# Patient Record
Sex: Female | Born: 1980 | Race: Black or African American | Hispanic: No | Marital: Single | Smoking: Never smoker
Health system: Southern US, Community
[De-identification: ages and names within clinical notes are randomized; demographics above are authoritative.]

## PROBLEM LIST (undated history)

## (undated) DIAGNOSIS — E669 Obesity, unspecified: Secondary | ICD-10-CM

## (undated) DIAGNOSIS — M1990 Unspecified osteoarthritis, unspecified site: Secondary | ICD-10-CM

## (undated) DIAGNOSIS — K219 Gastro-esophageal reflux disease without esophagitis: Secondary | ICD-10-CM

## (undated) DIAGNOSIS — N159 Renal tubulo-interstitial disease, unspecified: Secondary | ICD-10-CM

## (undated) DIAGNOSIS — B009 Herpesviral infection, unspecified: Secondary | ICD-10-CM

## (undated) HISTORY — DX: Obesity, unspecified: E66.9

## (undated) HISTORY — PX: DILATION AND CURETTAGE OF UTERUS: SHX78

---

## 2007-08-16 ENCOUNTER — Emergency Department (HOSPITAL_COMMUNITY): Admission: EM | Admit: 2007-08-16 | Discharge: 2007-08-16 | Payer: Self-pay | Admitting: Emergency Medicine

## 2008-11-21 ENCOUNTER — Other Ambulatory Visit: Admission: RE | Admit: 2008-11-21 | Discharge: 2008-11-21 | Payer: Self-pay | Admitting: Obstetrics & Gynecology

## 2009-05-12 ENCOUNTER — Ambulatory Visit (HOSPITAL_COMMUNITY): Admission: RE | Admit: 2009-05-12 | Discharge: 2009-05-12 | Payer: Self-pay | Admitting: Obstetrics and Gynecology

## 2009-05-15 ENCOUNTER — Ambulatory Visit (HOSPITAL_COMMUNITY): Admission: RE | Admit: 2009-05-15 | Discharge: 2009-05-15 | Payer: Self-pay | Admitting: Obstetrics and Gynecology

## 2009-05-29 ENCOUNTER — Ambulatory Visit (HOSPITAL_COMMUNITY): Admission: RE | Admit: 2009-05-29 | Discharge: 2009-05-29 | Payer: Self-pay | Admitting: Obstetrics and Gynecology

## 2009-06-19 ENCOUNTER — Ambulatory Visit (HOSPITAL_COMMUNITY): Admission: RE | Admit: 2009-06-19 | Discharge: 2009-06-19 | Payer: Self-pay | Admitting: Obstetrics and Gynecology

## 2009-06-24 ENCOUNTER — Ambulatory Visit (HOSPITAL_COMMUNITY): Admission: RE | Admit: 2009-06-24 | Discharge: 2009-06-24 | Payer: Self-pay | Admitting: Obstetrics and Gynecology

## 2010-08-04 ENCOUNTER — Ambulatory Visit (HOSPITAL_COMMUNITY): Admission: RE | Admit: 2010-08-04 | Discharge: 2010-08-04 | Payer: Self-pay | Admitting: Obstetrics & Gynecology

## 2010-12-03 ENCOUNTER — Ambulatory Visit (HOSPITAL_COMMUNITY): Admission: RE | Admit: 2010-12-03 | Payer: Self-pay | Source: Home / Self Care | Admitting: Obstetrics & Gynecology

## 2010-12-09 ENCOUNTER — Ambulatory Visit (HOSPITAL_COMMUNITY): Admission: RE | Admit: 2010-12-09 | Payer: Self-pay | Source: Home / Self Care | Admitting: Obstetrics & Gynecology

## 2011-01-10 ENCOUNTER — Encounter: Payer: Self-pay | Admitting: Obstetrics & Gynecology

## 2011-02-12 ENCOUNTER — Inpatient Hospital Stay (HOSPITAL_COMMUNITY)
Admission: AD | Admit: 2011-02-12 | Discharge: 2011-02-12 | Disposition: A | Discharge status: Home / Self Care | Payer: Medicaid Other | Source: Ambulatory Visit | Attending: Obstetrics & Gynecology | Admitting: Obstetrics & Gynecology

## 2011-02-12 DIAGNOSIS — K5289 Other specified noninfective gastroenteritis and colitis: Secondary | ICD-10-CM

## 2011-02-12 DIAGNOSIS — O212 Late vomiting of pregnancy: Secondary | ICD-10-CM | POA: Insufficient documentation

## 2011-02-12 DIAGNOSIS — O9989 Other specified diseases and conditions complicating pregnancy, childbirth and the puerperium: Secondary | ICD-10-CM

## 2011-02-12 DIAGNOSIS — O99891 Other specified diseases and conditions complicating pregnancy: Secondary | ICD-10-CM | POA: Insufficient documentation

## 2011-02-12 LAB — URINALYSIS, ROUTINE W REFLEX MICROSCOPIC
Bilirubin Urine: NEGATIVE
Nitrite: NEGATIVE
Specific Gravity, Urine: 1.02 (ref 1.005–1.030)
Urobilinogen, UA: 1 mg/dL (ref 0.0–1.0)
pH: 6.5 (ref 5.0–8.0)

## 2011-02-12 LAB — URINE MICROSCOPIC-ADD ON

## 2011-02-15 LAB — URINE CULTURE: Colony Count: >=100000

## 2011-02-24 ENCOUNTER — Inpatient Hospital Stay (HOSPITAL_COMMUNITY)
Admission: AD | Admit: 2011-02-24 | Discharge: 2011-02-26 | DRG: 775 | Disposition: A | Discharge status: Home / Self Care | Payer: 59 | Source: Ambulatory Visit | Attending: Obstetrics & Gynecology | Admitting: Obstetrics & Gynecology

## 2011-02-24 ENCOUNTER — Inpatient Hospital Stay (HOSPITAL_COMMUNITY): Payer: 59

## 2011-02-24 LAB — CBC
Hemoglobin: 9.3 g/dL — ABNORMAL LOW (ref 12.0–15.0)
MCH: 18.8 pg — ABNORMAL LOW (ref 26.0–34.0)
Platelets: 271 10*3/uL (ref 150–400)
RBC: 4.95 MIL/uL (ref 3.87–5.11)
WBC: 8.8 10*3/uL (ref 4.0–10.5)

## 2011-02-24 LAB — RPR: RPR Ser Ql: NONREACTIVE

## 2011-02-25 ENCOUNTER — Other Ambulatory Visit: Payer: Self-pay | Admitting: Obstetrics & Gynecology

## 2011-02-25 LAB — CBC
HCT: 28.6 % — ABNORMAL LOW (ref 36.0–46.0)
Hemoglobin: 8.6 g/dL — ABNORMAL LOW (ref 12.0–15.0)
MCHC: 30.1 g/dL (ref 30.0–36.0)
RBC: 4.56 MIL/uL (ref 3.87–5.11)
WBC: 12.2 10*3/uL — ABNORMAL HIGH (ref 4.0–10.5)

## 2011-03-01 NOTE — H&P (Signed)
  NAME:  Draheim, Verdis            ACCOUNT NO.:  [id number]  MEDICAL RECORD NO.:  [id number]           PATIENT TYPE:  I  LOCATION:  9131                          FACILITY:  [facility]  PHYSICIAN:  Roseanna Rainbow, M.D.DATE OF BIRTH:  Mar 22, 1981  DATE OF ADMISSION:  02/24/2011 DATE OF DISCHARGE:                             HISTORY & PHYSICAL   CHIEF COMPLAINT:  The patient is a 30 year old para 1 with an estimated date of confinement of March 19, complaining of rupture of membranes.  HISTORY OF PRESENT ILLNESS:  The patient reports spontaneous rupture of membranes several hours prior to presentation with clear fluid.  She denies regular contractions.  ALLERGIES:  PENICILLIN.  MEDICATIONS:  Valtrex.  PAST OBSTETRICS HISTORY:  There is a history of a voluntary termination of pregnancy.  In October 2010, she was delivered at term, 7-pound female, vaginal delivery, no complications.  PRENATAL LABS:  GBS negative on February 13.  Chlamydia probe negative. Urine culture and sensitivity, no growth.  Pap smear, ASCUS.  GC probe negative.  Two-hour GTT normal.  Hepatitis B surface antigen negative. Hematocrit 34.1.  Herpes culture positive August 2011.  Hemoglobin 10.4, HIV nonreactive, platelets 312,000.  Blood type AB positive, antibody screen negative, RPR nonreactive, rubella immune.  Sickle cell negative. Vitamin D 17 in July 2011; 11 in February 2012.  Ultrasound on August 16, crown rump length 9 weeks 1 day with an [facility] of August 19.  There is a complex right adnexal mass suspicious for dermoid.  An ultrasound at 19 weeks, normal anatomy.  OBSTETRICS RISK FACTORS:  History of genital herpes and obesity.  PAST GYNECOLOGIC HISTORY:  Please see the above.  PAST MEDICAL HISTORY:  Vitamin D deficiency.  PAST SURGICAL HISTORY:  No previous surgery.  SOCIAL HISTORY:  She works as a Haematologist.  She is married, living with her spouse.  Does not give any significant history of  alcohol usage.  Has no significant smoking history.  She reports stress at work and financial difficulties.  She denies illicit drug use.  FAMILY HISTORY:  Remarkable for adult-onset diabetes, hypertension.  GENETIC HISTORY:  The patient's daughter has had cleft lip.  REVIEW OF SYSTEMS:  GU:  Please see the above.  PHYSICAL EXAMINATION:  VITAL SIGNS:  Stable, afebrile.  Fetal heart tracing moderate long-term variability, accelerations present.  No decelerations.  Tocodynamometer, rare uterine contractions. GENERAL EXAM:  African American female, obese body habitus.  No acute distress. ABDOMEN:  Gravid.  Sterile vaginal exam per the RN.  The cervix is loose, 1-cm dilated.  There is gross rupture of fluid.  ASSESSMENT: 1. Primipara, at term. 2. Spontaneous rupture of membranes. 3. Group B streptococcus negative. 4. History of genital herpes, no symptoms at present, on Valtrex for     suppression. 5. Category I fetal heart tracing.  PLAN:  Admission, likely augmentation of labor with low-dose Pitocin per protocol.     Roseanna Rainbow, M.D.     Judee Clara  D:  02/24/2011  T:  02/25/2011  Job:  [id number]  Electronically Signed by Antionette Char M.D. on 03/01/2011 09:51:14 PM

## 2011-04-30 ENCOUNTER — Other Ambulatory Visit: Payer: Self-pay | Admitting: Obstetrics & Gynecology

## 2011-04-30 ENCOUNTER — Encounter (HOSPITAL_COMMUNITY): Payer: 59 | Attending: Obstetrics & Gynecology

## 2011-04-30 DIAGNOSIS — Z01812 Encounter for preprocedural laboratory examination: Secondary | ICD-10-CM | POA: Insufficient documentation

## 2011-04-30 LAB — CBC
Hemoglobin: 9.9 g/dL — ABNORMAL LOW (ref 12.0–15.0)
MCH: 19.5 pg — ABNORMAL LOW (ref 26.0–34.0)
MCV: 65.9 fL — ABNORMAL LOW (ref 78.0–100.0)
Platelets: 307 10*3/uL (ref 150–400)
RBC: 5.07 MIL/uL (ref 3.87–5.11)
WBC: 6 10*3/uL (ref 4.0–10.5)

## 2011-05-06 ENCOUNTER — Ambulatory Visit (HOSPITAL_COMMUNITY): Admission: RE | Admit: 2011-05-06 | Payer: 59 | Source: Ambulatory Visit | Admitting: Obstetrics & Gynecology

## 2011-05-06 LAB — PREGNANCY, URINE: Preg Test, Ur: NEGATIVE

## 2011-07-23 ENCOUNTER — Encounter (HOSPITAL_COMMUNITY): Admission: RE | Payer: Self-pay | Source: Ambulatory Visit

## 2011-07-23 ENCOUNTER — Ambulatory Visit (HOSPITAL_COMMUNITY): Admission: RE | Admit: 2011-07-23 | Payer: Self-pay | Source: Ambulatory Visit | Admitting: Obstetrics & Gynecology

## 2011-07-23 SURGERY — ESSURE TUBAL STERILIZATION
Anesthesia: General | Laterality: Bilateral

## 2011-10-01 LAB — COMPREHENSIVE METABOLIC PANEL
ALT: 18
AST: 24
Alkaline Phosphatase: 69
CO2: 23
Calcium: 8.7
GFR calc Af Amer: >60
Potassium: 3.8
Sodium: 138
Total Protein: 7.2

## 2011-10-01 LAB — URINALYSIS, ROUTINE W REFLEX MICROSCOPIC
Bilirubin Urine: NEGATIVE
Glucose, UA: NEGATIVE
Hgb urine dipstick: NEGATIVE
Ketones, ur: NEGATIVE
Nitrite: NEGATIVE
Protein, ur: NEGATIVE
Specific Gravity, Urine: 1.031 — ABNORMAL HIGH
Urobilinogen, UA: 0.2
pH: 6

## 2011-10-01 LAB — URINE MICROSCOPIC-ADD ON

## 2011-10-01 LAB — DIFFERENTIAL
Basophils Relative: 1
Eosinophils Relative: 0
Lymphs Abs: 1.2
Monocytes Absolute: 0.2
Neutro Abs: 10 — ABNORMAL HIGH

## 2011-10-01 LAB — CBC
MCHC: 31.5
RBC: 5.32 — ABNORMAL HIGH
RDW: 20.9 — ABNORMAL HIGH

## 2011-10-01 LAB — POCT PREGNANCY, URINE
Operator id: 26403
Preg Test, Ur: NEGATIVE

## 2011-12-30 ENCOUNTER — Ambulatory Visit: Payer: Self-pay | Admitting: Family Medicine

## 2011-12-30 ENCOUNTER — Ambulatory Visit (INDEPENDENT_AMBULATORY_CARE_PROVIDER_SITE_OTHER): Payer: 59 | Admitting: Family Medicine

## 2011-12-30 DIAGNOSIS — E669 Obesity, unspecified: Secondary | ICD-10-CM

## 2011-12-30 DIAGNOSIS — S335XXA Sprain of ligaments of lumbar spine, initial encounter: Secondary | ICD-10-CM

## 2012-01-20 ENCOUNTER — Ambulatory Visit: Payer: 59 | Admitting: Family Medicine

## 2012-06-01 LAB — HEMOGLOBIN
Hemoglobin: 13.1 g/dL (ref 12.0–16.0)
Hgb A1c MFr Bld: 5.7 % (ref 4.0–6.0)

## 2012-06-01 LAB — LIPID PANEL
Cholesterol: 131 mg/dL (ref 0–200)
HDL: 42 mg/dL (ref 35–70)
LDL (calc): 80
Total CHOL/HDL Ratio: 3.1
Triglycerides: 47
VLDL: 9 mg/dL

## 2012-06-23 ENCOUNTER — Ambulatory Visit (INDEPENDENT_AMBULATORY_CARE_PROVIDER_SITE_OTHER): Payer: 59 | Admitting: Internal Medicine

## 2012-06-23 ENCOUNTER — Encounter: Payer: Self-pay | Admitting: Internal Medicine

## 2012-06-23 VITALS — BP 118/78 | HR 82 | Temp 97.8°F | Resp 16 | Ht 66.0 in | Wt 256.0 lb

## 2012-06-23 DIAGNOSIS — E669 Obesity, unspecified: Secondary | ICD-10-CM

## 2012-06-23 DIAGNOSIS — R7309 Other abnormal glucose: Secondary | ICD-10-CM

## 2012-06-23 DIAGNOSIS — R7303 Prediabetes: Secondary | ICD-10-CM

## 2012-06-23 NOTE — Progress Notes (Signed)
  Subjective:    Patient ID: Meredith Long, female    DOB: 04-18-1981, 31 y.o.   MRN: [id number]  HPI She comes in to discuss her mild blood sugar elevations.   Review of Systems  Constitutional: Negative.   HENT: Negative.   Eyes: Negative.   Respiratory: Negative.   Cardiovascular: Negative.   Gastrointestinal: Negative.   Genitourinary: Negative.   Musculoskeletal: Negative.   Skin: Negative.   Neurological: Negative.   Hematological: Negative.   Psychiatric/Behavioral: Negative.        Objective:   Physical Exam  Vitals reviewed. Constitutional: She is oriented to person, place, and time. She appears well-developed and well-nourished. No distress.  HENT:  Head: Normocephalic and atraumatic.  Mouth/Throat: Oropharynx is clear and moist. No oropharyngeal exudate.  Eyes: Conjunctivae are normal. Right eye exhibits no discharge. Left eye exhibits no discharge. No scleral icterus.  Neck: Normal range of motion. Neck supple. No JVD present. No tracheal deviation present. No thyromegaly present.  Cardiovascular: Normal rate, regular rhythm, normal heart sounds and intact distal pulses.  Exam reveals no gallop and no friction rub.   No murmur heard. Pulmonary/Chest: Effort normal and breath sounds normal. No stridor. No respiratory distress. She has no wheezes. She has no rales. She exhibits no tenderness.  Abdominal: Soft. Bowel sounds are normal. She exhibits no distension and no mass. There is no tenderness. There is no rebound and no guarding.  Musculoskeletal: Normal range of motion. She exhibits no edema and no tenderness.  Lymphadenopathy:    She has no cervical adenopathy.  Neurological: She is oriented to person, place, and time.  Skin: Skin is warm and dry. No rash noted. She is not diaphoretic. No erythema. No pallor.  Psychiatric: She has a normal mood and affect. Her behavior is normal. Judgment and thought content normal.      Lab Results  Component Value  Date   WBC 6.0 04/30/2011   HGB 13.1 05/22/2012   HCT 33.4* 04/30/2011   PLT 307 04/30/2011   GLUCOSE 123* 08/16/2007   CHOL 131 05/22/2012   TRIG 47 05/22/2012   HDL 42 05/22/2012   LDLCALC 80 05/22/2012   ALT 18 08/16/2007   AST 24 08/16/2007   NA 138 08/16/2007   K 3.8 08/16/2007   CL 107 08/16/2007   CREATININE 0.66 08/16/2007   BUN 7 08/16/2007   CO2 23 08/16/2007   HGBA1C 5.7 05/22/2012      Assessment & Plan:

## 2012-06-23 NOTE — Assessment & Plan Note (Signed)
She is doing well with lifestyle modifications, I think seeing nutrition/diabetic education will help as well

## 2012-06-23 NOTE — Patient Instructions (Signed)
Hyperglycemia  Hyperglycemia occurs when the glucose (sugar) in your blood is too high. Hyperglycemia can happen for many reasons, but it most often happens to people who do not know they have diabetes or are not managing their diabetes properly.   CAUSES   Whether you have diabetes or not, there are other causes of hyperglycemia. Hyperglycemia can occur when you have diabetes, but it can also occur in other situations that you might not be as aware of, such as:  Diabetes  · If you have diabetes and are having problems controlling your blood glucose, hyperglycemia could occur because of some of the following reasons:  · Not following your meal plan.  · Not taking your diabetes medications or not taking it properly.  · Exercising less or doing less activity than you normally do.  · Being sick.  Pre-diabetes  · This cannot be ignored. Before people develop Type 2 diabetes, they almost always have "pre-diabetes." This is when your blood glucose levels are higher than normal, but not yet high enough to be diagnosed as diabetes. Research has shown that some long-term damage to the body, especially the heart and circulatory system, may already be occurring during pre-diabetes. If you take action to manage your blood glucose when you have pre-diabetes, you may delay or prevent Type 2 diabetes from developing.  Stress  · If you have diabetes, you may be "diet" controlled or on oral medications or insulin to control your diabetes. However, you may find that your blood glucose is higher than usual in the hospital whether you have diabetes or not. This is often referred to as "stress hyperglycemia." Stress can elevate your blood glucose. This happens because of hormones put out by the body during times of stress. If stress has been the cause of your high blood glucose, it can be followed regularly by your caregiver. That way he/she can make sure your hyperglycemia does not continue to get worse or progress to  diabetes.  Steroids  · Steroids are medications that act on the infection fighting system (immune system) to block inflammation or infection. One side effect can be a rise in blood glucose. Most people can produce enough extra insulin to allow for this rise, but for those who cannot, steroids make blood glucose levels go even higher. It is not unusual for steroid treatments to "uncover" diabetes that is developing. It is not always possible to determine if the hyperglycemia will go away after the steroids are stopped. A special blood test called an A1c is sometimes done to determine if your blood glucose was elevated before the steroids were started.  SYMPTOMS  · Thirsty.  · Frequent urination.  · Dry mouth.  · Blurred vision.  · Tired or fatigue.  · Weakness.  · Sleepy.  · Tingling in feet or leg.  DIAGNOSIS   Diagnosis is made by monitoring blood glucose in one or all of the following ways:  · A1c test. This is a chemical found in your blood.  · Fingerstick blood glucose monitoring.  · Laboratory results.  TREATMENT   First, knowing the cause of the hyperglycemia is important before the hyperglycemia can be treated. Treatment may include, but is not be limited to:  · Education.  · Change or adjustment in medications.  · Change or adjustment in meal plan.  · Treatment for an illness, infection, etc.  · More frequent blood glucose monitoring.  · Change in exercise plan.  · Decreasing or stopping steroids.  ·   Lifestyle changes.  HOME CARE INSTRUCTIONS   · Test your blood glucose as directed.  · Exercise regularly. Your caregiver will give you instructions about exercise. Pre-diabetes or diabetes which comes on with stress is helped by exercising.  · Eat wholesome, balanced meals. Eat often and at regular, fixed times. Your caregiver or nutritionist will give you a meal plan to guide your sugar intake.  · Being at an ideal weight is important. If needed, losing as little as 10 to 15 pounds may help improve blood  glucose levels.  SEEK MEDICAL CARE IF:   · You have questions about medicine, activity, or diet.  · You continue to have symptoms (problems such as increased thirst, urination, or weight gain).  SEEK IMMEDIATE MEDICAL CARE IF:   · You are vomiting or have diarrhea.  · Your breath smells fruity.  · You are breathing faster or slower.  · You are very sleepy or incoherent.  · You have numbness, tingling, or pain in your feet or hands.  · You have chest pain.  · Your symptoms get worse even though you have been following your caregiver's orders.  · If you have any other questions or concerns.  Document Released: 06/01/2001 Document Revised: 11/25/2011 Document Reviewed: 07/28/2009  ExitCare® Patient Information ©2012 [employer].

## 2012-06-23 NOTE — Assessment & Plan Note (Signed)
She has made great improvements in this over the last year by losing 30 pounds, she will see nutrition for further support about her diet

## 2012-07-03 ENCOUNTER — Other Ambulatory Visit: Payer: Self-pay | Admitting: Obstetrics

## 2012-08-29 ENCOUNTER — Ambulatory Visit (INDEPENDENT_AMBULATORY_CARE_PROVIDER_SITE_OTHER): Payer: 59 | Admitting: Internal Medicine

## 2012-08-29 ENCOUNTER — Encounter: Payer: Self-pay | Admitting: Internal Medicine

## 2012-08-29 VITALS — BP 108/80 | HR 76 | Temp 98.8°F | Resp 16 | Ht 66.0 in | Wt 249.0 lb

## 2012-08-29 DIAGNOSIS — E669 Obesity, unspecified: Secondary | ICD-10-CM

## 2012-08-29 MED ORDER — PHENTERMINE-TOPIRAMATE ER 7.5-46 MG PO CP24: 1.0000 | ORAL_CAPSULE | Freq: Every morning | ORAL | Status: DC

## 2012-08-29 MED ORDER — PHENTERMINE-TOPIRAMATE ER 3.75-23 MG PO CP24: 1.0000 | ORAL_CAPSULE | Freq: Every morning | ORAL | Status: DC

## 2012-08-29 NOTE — Assessment & Plan Note (Signed)
She will continue with lifestyle modifications and will try Qsymia

## 2012-08-29 NOTE — Patient Instructions (Signed)
Obesity Obesity is defined as having a body mass index (BMI) of 30 or more. To calculate your BMI divide your weight in pounds by your height in inches squared and multiply that product by 703. Major illnesses resulting from long-term obesity include:  Stroke.   Heart disease.   Diabetes.   Many cancers.   Arthritis.  Obesity also complicates recovery from many other medical problems.  CAUSES   A history of obesity in your parents.   Thyroid hormone imbalance.   Environmental factors such as excess calorie intake and physical inactivity.  TREATMENT  A healthy weight loss program includes:  A calorie restricted diet based on individual calorie needs.   Increased physical activity (exercise).  An exercise program is just as important as the right low-calorie diet.  Weight-loss medicines should be used only under the supervision of your physician. These medicines help, but only if they are used with diet and exercise programs. Medicines can have side effects including nervousness, nausea, abdominal pain, diarrhea, headache, drowsiness, and depression.  An unhealthy weight loss program includes:  Fasting.   Fad diets.   Supplements and drugs.  These choices do not succeed in long-term weight control.  HOME CARE INSTRUCTIONS  To help you make the needed dietary changes:   Exercise and perform physical activity as directed by your caregiver.   Keep a daily record of everything you eat. There are many free websites to help you with this. It may be helpful to measure your foods so you can determine if you are eating the correct portion sizes.   Use low-calorie cookbooks or take special cooking classes.   Avoid alcohol. Drink more water and drinks with no calories.   Take vitamins and supplements only as recommended by your caregiver.   Weight loss support groups, Registered Dieticians, counselors, and stress reduction education can also be very helpful.  Document Released:  01/13/2005 Document Revised: 11/25/2011 Document Reviewed: 11/12/2007 ExitCare Patient Information 2012 [employer]. 

## 2012-08-29 NOTE — Progress Notes (Signed)
  Subjective:    Patient ID: Meredith Long, female    DOB: 1981-11-18, 31 y.o.   MRN: [id number]  HPI  She returns and she asks that I prescribe Qsymia to help her lose weight.  Review of Systems  Constitutional: Negative.   HENT: Negative.   Eyes: Negative.   Respiratory: Negative.   Cardiovascular: Negative.   Gastrointestinal: Negative.   Genitourinary: Negative.   Musculoskeletal: Negative.   Skin: Negative.   Neurological: Negative.   Hematological: Negative.   Psychiatric/Behavioral: Negative.        Objective:   Physical Exam  Vitals reviewed. Constitutional: She is oriented to person, place, and time. She appears well-developed and well-nourished. No distress.  HENT:  Head: Normocephalic and atraumatic.  Mouth/Throat: Oropharynx is clear and moist. No oropharyngeal exudate.  Eyes: Conjunctivae are normal. Right eye exhibits no discharge. Left eye exhibits no discharge. No scleral icterus.  Neck: Normal range of motion. Neck supple. No JVD present. No tracheal deviation present. No thyromegaly present.  Cardiovascular: Normal rate, regular rhythm, normal heart sounds and intact distal pulses.  Exam reveals no gallop and no friction rub.   No murmur heard. Pulmonary/Chest: Effort normal and breath sounds normal. No stridor. No respiratory distress. She has no wheezes. She has no rales. She exhibits no tenderness.  Abdominal: Soft. Bowel sounds are normal. She exhibits no distension and no mass. There is no tenderness. There is no rebound and no guarding.  Musculoskeletal: Normal range of motion. She exhibits no edema and no tenderness.  Lymphadenopathy:    She has no cervical adenopathy.  Neurological: She is oriented to person, place, and time.  Skin: Skin is warm and dry. No rash noted. She is not diaphoretic. No erythema. No pallor.  Psychiatric: She has a normal mood and affect. Her behavior is normal. Judgment and thought content normal.     Lab Results    Component Value Date   WBC 6.0 04/30/2011   HGB 13.1 05/22/2012   HCT 33.4* 04/30/2011   PLT 307 04/30/2011   GLUCOSE 123* 08/16/2007   CHOL 131 05/22/2012   TRIG 47 05/22/2012   HDL 42 05/22/2012   LDLCALC 80 05/22/2012   ALT 18 08/16/2007   AST 24 08/16/2007   NA 138 08/16/2007   K 3.8 08/16/2007   CL 107 08/16/2007   CREATININE 0.66 08/16/2007   BUN 7 08/16/2007   CO2 23 08/16/2007   HGBA1C 5.7 05/22/2012       Assessment & Plan:

## 2012-08-31 ENCOUNTER — Telehealth: Payer: Self-pay | Admitting: *Deleted

## 2012-08-31 NOTE — Telephone Encounter (Signed)
I called to initiate PA for Qsymia 3.75-23 mg. I gave all pertinent info and representative states it will go to pharmacist for review and we will receive notification back via fax with decision.  Case ID # [id number] Mem ID# [id number]

## 2012-09-01 ENCOUNTER — Telehealth: Payer: Self-pay | Admitting: Internal Medicine

## 2012-09-01 MED ORDER — PHENTERMINE HCL 37.5 MG PO TABS: 37.5000 mg | ORAL_TABLET | Freq: Every day | ORAL | Status: DC

## 2012-09-01 NOTE — Telephone Encounter (Signed)
ok 

## 2012-09-01 NOTE — Telephone Encounter (Signed)
Pt can not afford Osymia, says she has spoken with pharmacy and they are not going to approve this medication, in order for her to be able to pay out of pocket for the medication she needs to have only the phentermine called in to the [facility] on [address]

## 2012-09-13 ENCOUNTER — Telehealth: Payer: Self-pay

## 2012-09-13 DIAGNOSIS — E669 Obesity, unspecified: Secondary | ICD-10-CM

## 2012-09-13 MED ORDER — TOPIRAMATE 25 MG PO TABS: 25.0000 mg | ORAL_TABLET | Freq: Two times a day (BID) | ORAL | Status: DC

## 2012-09-13 NOTE — Telephone Encounter (Signed)
done

## 2012-09-13 NOTE — Telephone Encounter (Signed)
Pt called requesting Rx for Topamax to [facility]. Pt states original Rx for Osymia was D/C because it was not covered by insurance but Rx for Phentermine only was substituted, please advise.

## 2012-10-24 ENCOUNTER — Ambulatory Visit (INDEPENDENT_AMBULATORY_CARE_PROVIDER_SITE_OTHER): Payer: 59 | Admitting: Internal Medicine

## 2012-10-24 ENCOUNTER — Encounter: Payer: Self-pay | Admitting: Internal Medicine

## 2012-10-24 VITALS — BP 110/68 | HR 86 | Temp 97.7°F | Resp 16 | Wt 239.0 lb

## 2012-10-24 DIAGNOSIS — E669 Obesity, unspecified: Secondary | ICD-10-CM

## 2012-10-24 NOTE — Progress Notes (Signed)
  Subjective:    Patient ID: Meredith Long, female    DOB: [birth year], 31 y.o.   MRN: [id number]  HPI  She returns for f/up on her weight loss regimen, she is doing well on her meds with no side effects or complications. She has lost 10 pounds and is exercising.  Review of Systems  Constitutional: Negative for fever, chills, diaphoresis, activity change, appetite change, fatigue and unexpected weight change.  HENT: Negative.   Eyes: Negative.   Respiratory: Negative for apnea, cough, chest tightness, shortness of breath, wheezing and stridor.   Cardiovascular: Negative for chest pain, palpitations and leg swelling.  Gastrointestinal: Negative for nausea, vomiting, abdominal pain, diarrhea, constipation and blood in stool.  Genitourinary: Negative.   Musculoskeletal: Negative for myalgias, back pain, joint swelling, arthralgias and gait problem.  Skin: Negative for color change, pallor, rash and wound.  Neurological: Negative.   Hematological: Negative for adenopathy. Does not bruise/bleed easily.  Psychiatric/Behavioral: Negative.        Objective:   Physical Exam  Vitals reviewed. Constitutional: She is oriented to person, place, and time. She appears well-developed and well-nourished. No distress.  HENT:  Head: Normocephalic and atraumatic.  Mouth/Throat: Oropharynx is clear and moist. No oropharyngeal exudate.  Eyes: Conjunctivae normal are normal. Right eye exhibits no discharge. Left eye exhibits no discharge. No scleral icterus.  Neck: Normal range of motion. Neck supple. No JVD present. No tracheal deviation present. No thyromegaly present.  Cardiovascular: Normal rate, regular rhythm, normal heart sounds and intact distal pulses.  Exam reveals no gallop and no friction rub.   No murmur heard. Pulmonary/Chest: Effort normal and breath sounds normal. No stridor. No respiratory distress. She has no wheezes. She has no rales. She exhibits no tenderness.  Abdominal: Soft.  Bowel sounds are normal. She exhibits no distension and no mass. There is no tenderness. There is no rebound and no guarding.  Musculoskeletal: Normal range of motion. She exhibits no edema and no tenderness.  Lymphadenopathy:    She has no cervical adenopathy.  Neurological: She is oriented to person, place, and time.  Skin: Skin is warm and dry. No rash noted. She is not diaphoretic. No erythema. No pallor.  Psychiatric: She has a normal mood and affect. Her behavior is normal. Judgment and thought content normal.      Lab Results  Component Value Date   WBC 6.0 04/30/2011   HGB 13.1 05/22/2012   HCT 33.4* 04/30/2011   PLT 307 04/30/2011   GLUCOSE 123* 08/16/2007   CHOL 131 05/22/2012   TRIG 47 05/22/2012   HDL 42 05/22/2012   LDLCALC 80 05/22/2012   ALT 18 08/16/2007   AST 24 08/16/2007   NA 138 08/16/2007   K 3.8 08/16/2007   CL 107 08/16/2007   CREATININE 0.66 08/16/2007   BUN 7 08/16/2007   CO2 23 08/16/2007   HGBA1C 5.7 05/22/2012      Assessment & Plan:

## 2012-10-25 ENCOUNTER — Encounter: Payer: Self-pay | Admitting: Internal Medicine

## 2012-10-25 NOTE — Patient Instructions (Addendum)
Obesity Obesity is defined as having too much total body fat and a body mass index (BMI) of 30 or more. BMI is an estimate of body fat and is calculated from your height and weight. Obesity happens when you consume more calories than you can burn by exercising or performing daily physical tasks. Prolonged obesity can cause major illnesses or emergencies, such as:   A stroke.  Heart disease.  Diabetes.  Cancer.  Arthritis.  High blood pressure (hypertension).  High cholesterol.  Sleep apnea.  Erectile dysfunction.  Infertility problems. CAUSES   Regularly eating unhealthy foods.  Physical inactivity.  Certain disorders, such as an underactive thyroid (hypothyroidism), Cushing's syndrome, and polycystic ovarian syndrome.  Certain medicines, such as steroids, some depression medicines, and antipsychotics.  Genetics.  Lack of sleep. DIAGNOSIS  A caregiver can diagnose obesity after calculating your BMI. Obesity will be diagnosed if your BMI is 30 or higher.  There are other methods of measuring obesity levels. Some other methods include measuring your skin fold thickness, your waist circumference, and comparing your hip circumference to your waist circumference. TREATMENT  A healthy treatment program includes some or all of the following:  Long-term dietary changes.  Exercise and physical activity.  Behavioral and lifestyle changes.  Medicine only under the supervision of your caregiver. Medicines may help, but only if they are used with diet and exercise programs. An unhealthy treatment program includes:  Fasting.  Fad diets.  Supplements and drugs. These choices do not succeed in long-term weight control.  HOME CARE INSTRUCTIONS   Exercise and perform physical activity as directed by your caregiver. To increase physical activity, try the following:  Use stairs instead of elevators.  Park farther away from store entrances.  Garden, bike, or walk instead of  watching television or using the computer.  Eat healthy, low-calorie foods and drinks on a regular basis. Eat more fruits and vegetables. Use low-calorie cookbooks or take healthy cooking classes.  Limit fast food, sweets, and processed snack foods.  Eat smaller portions.  Keep a daily journal of everything you eat. There are many free websites to help you with this. It may be helpful to measure your foods so you can determine if you are eating the correct portion sizes.  Avoid drinking alcohol. Drink more water and drinks without calories.  Take vitamins and supplements only as recommended by your caregiver.  Weight-loss support groups, Registered Dieticians, counselors, and stress reduction education can also be very helpful. SEEK IMMEDIATE MEDICAL CARE IF:  You have chest pain or tightness.  You have trouble breathing or feel short of breath.  You have weakness or leg numbness.  You feel confused or have trouble talking.  You have sudden changes in your vision. MAKE SURE YOU:  Understand these instructions.  Will watch your condition.  Will get help right away if you are not doing well or get worse. Document Released: 01/13/2005 Document Revised: 06/06/2012 Document Reviewed: 01/12/2012 ExitCare Patient Information 2013 [employer].  

## 2012-10-25 NOTE — Assessment & Plan Note (Signed)
She was praised for her success and will continue her current regimen

## 2012-11-27 ENCOUNTER — Telehealth: Payer: Self-pay

## 2012-11-27 NOTE — Telephone Encounter (Signed)
Patient notified per MD.

## 2012-11-27 NOTE — Telephone Encounter (Signed)
Patient called stating that she has been on birth control x 2 yrs. She recently had a missed period, took home pregnancy test (confirmed positive). She would like to know if [facility] became less effective due to taking topamax and phentermine. Please advise, pt states that was not trying to get pregnant. Thanks

## 2012-11-27 NOTE — Telephone Encounter (Signed)
I don't think either of those drugs causes BC to be less effective

## 2012-12-31 ENCOUNTER — Encounter (HOSPITAL_COMMUNITY): Payer: Self-pay | Admitting: *Deleted

## 2012-12-31 ENCOUNTER — Emergency Department (HOSPITAL_COMMUNITY)
Admission: EM | Admit: 2012-12-31 | Discharge: 2012-12-31 | Disposition: A | Discharge status: Home / Self Care | Payer: 59 | Attending: Emergency Medicine | Admitting: Emergency Medicine

## 2012-12-31 DIAGNOSIS — R1110 Vomiting, unspecified: Secondary | ICD-10-CM | POA: Insufficient documentation

## 2012-12-31 DIAGNOSIS — N390 Urinary tract infection, site not specified: Secondary | ICD-10-CM | POA: Insufficient documentation

## 2012-12-31 DIAGNOSIS — N12 Tubulo-interstitial nephritis, not specified as acute or chronic: Secondary | ICD-10-CM | POA: Insufficient documentation

## 2012-12-31 DIAGNOSIS — Z79899 Other long term (current) drug therapy: Secondary | ICD-10-CM | POA: Insufficient documentation

## 2012-12-31 DIAGNOSIS — K219 Gastro-esophageal reflux disease without esophagitis: Secondary | ICD-10-CM | POA: Insufficient documentation

## 2012-12-31 HISTORY — DX: Gastro-esophageal reflux disease without esophagitis: K21.9

## 2012-12-31 LAB — CBC WITH DIFFERENTIAL/PLATELET
Basophils Relative: 0 % (ref 0–1)
Eosinophils Absolute: 0.1 10*3/uL (ref 0.0–0.7)
Eosinophils Relative: 1 % (ref 0–5)
HCT: 38.5 % (ref 36.0–46.0)
Hemoglobin: 12.6 g/dL (ref 12.0–15.0)
Lymphocytes Relative: 18 % (ref 12–46)
MCH: 24.5 pg — ABNORMAL LOW (ref 26.0–34.0)
MCHC: 32.7 g/dL (ref 30.0–36.0)
Neutro Abs: 7.8 10*3/uL — ABNORMAL HIGH (ref 1.7–7.7)
RBC: 5.14 MIL/uL — ABNORMAL HIGH (ref 3.87–5.11)

## 2012-12-31 LAB — COMPREHENSIVE METABOLIC PANEL
Alkaline Phosphatase: 67 U/L (ref 39–117)
BUN: 9 mg/dL (ref 6–23)
CO2: 27 mEq/L (ref 19–32)
GFR calc Af Amer: >90 mL/min (ref >90)
GFR calc non Af Amer: >90 mL/min (ref >90)
Glucose, Bld: 104 mg/dL — ABNORMAL HIGH (ref 70–99)
Potassium: 3.9 mEq/L (ref 3.5–5.1)
Total Bilirubin: 0.2 mg/dL — ABNORMAL LOW (ref 0.3–1.2)
Total Protein: 7.7 g/dL (ref 6.0–8.3)

## 2012-12-31 LAB — URINALYSIS, MICROSCOPIC ONLY
Bilirubin Urine: NEGATIVE
Ketones, ur: NEGATIVE mg/dL
Nitrite: NEGATIVE
Protein, ur: NEGATIVE mg/dL
Specific Gravity, Urine: 1.025 (ref 1.005–1.030)
Urobilinogen, UA: 0.2 mg/dL (ref 0.0–1.0)

## 2012-12-31 LAB — POCT PREGNANCY, URINE: Preg Test, Ur: NEGATIVE

## 2012-12-31 LAB — LIPASE, BLOOD: Lipase: 37 U/L (ref 11–59)

## 2012-12-31 MED ORDER — ONDANSETRON HCL 4 MG PO TABS: 4.0000 mg | ORAL_TABLET | Freq: Four times a day (QID) | ORAL | Status: DC

## 2012-12-31 MED ORDER — DEXTROSE 5 % IV SOLN
1.0000 g | Freq: Once | INTRAVENOUS | Status: AC
  Administered 2012-12-31: 1 g via INTRAVENOUS
  Filled 2012-12-31: qty 10

## 2012-12-31 MED ORDER — SODIUM CHLORIDE 0.9 % IV BOLUS (SEPSIS)
1000.0000 mL | Freq: Once | INTRAVENOUS | Status: AC
  Administered 2012-12-31: 1000 mL via INTRAVENOUS

## 2012-12-31 MED ORDER — ONDANSETRON HCL 4 MG/2ML IJ SOLN
4.0000 mg | Freq: Once | INTRAMUSCULAR | Status: AC
  Administered 2012-12-31: 4 mg via INTRAVENOUS
  Filled 2012-12-31: qty 2

## 2012-12-31 MED ORDER — OXYCODONE-ACETAMINOPHEN 5-325 MG PO TABS: 1.0000 | ORAL_TABLET | Freq: Four times a day (QID) | ORAL | Status: DC | PRN

## 2012-12-31 MED ORDER — CIPROFLOXACIN HCL 500 MG PO TABS: 500.0000 mg | ORAL_TABLET | Freq: Two times a day (BID) | ORAL | Status: DC

## 2012-12-31 MED ORDER — FENTANYL CITRATE 0.05 MG/ML IJ SOLN
50.0000 ug | Freq: Once | INTRAMUSCULAR | Status: AC
  Administered 2012-12-31: 50 ug via INTRAVENOUS
  Filled 2012-12-31: qty 2

## 2012-12-31 MED ORDER — HYDROMORPHONE HCL PF 1 MG/ML IJ SOLN
1.0000 mg | Freq: Once | INTRAMUSCULAR | Status: AC
  Administered 2012-12-31: 1 mg via INTRAVENOUS
  Filled 2012-12-31: qty 1

## 2012-12-31 NOTE — ED Notes (Signed)
Pt c/o RUQ and LUQ abdominal pain x 3 hrs. Pt c/o n/v x 3-4 episodes. Pt denies diarrhea at this time. Pt c/o extreme cramping pain. Pt states past hx of GERD and indicated that citrus fruits exacerbated this condition. Pt states past hx of manageable pain when eating these items. Pt states she ate an orange at 1700 yesterday.

## 2012-12-31 NOTE — ED Provider Notes (Signed)
Medical screening examination/treatment/procedure(s) were performed by non-physician practitioner and as supervising physician I was immediately available for consultation/collaboration.  Jasmine Awe, MD 12/31/12 [phone]

## 2012-12-31 NOTE — ED Provider Notes (Signed)
History     CSN: [id number]  Arrival date & time 12/31/12  0101   First MD Initiated Contact with Patient 12/31/12 0257      Chief Complaint  Patient presents with  . Abdominal Pain    (Consider location/radiation/quality/duration/timing/severity/associated sxs/prior treatment) HPI  She presents to the emergency department with complaints of right and left flank pain that began today evening and continued to worsen until about 3-4 hours prior to arrival it was severe. She is also having some vomiting with it. She admits that she has had some dysuria and suprapubic cramping. The patient is afebrile and is not tachycardic. Her vital signs are stable. She is vomited twice in the exam room.  Past Medical History  Diagnosis Date  . GERD (gastroesophageal reflux disease)     Past Surgical History  Procedure Date  . Dilation and curettage of uterus     Family History  Problem Relation Age of Onset  . Hyperlipidemia Other   . Hypertension Other   . Diabetes Other     History  Substance Use Topics  . Smoking status: Never Smoker   . Smokeless tobacco: Never Used  . Alcohol Use: No    OB History    Grav Para Term Preterm Abortions TAB SAB Ect Mult Living                  Review of Systems  Review of Systems  Gen: no weight loss, fevers, chills, night sweats  Eyes: no discharge or drainage, no occular pain or visual changes  Nose: no epistaxis or rhinorrhea  Mouth: no dental pain, no sore throat  Neck: no neck pain  Lungs:No wheezing, coughing or hemoptysis CV: no chest pain, palpitations, dependent edema or orthopnea  Abd: + bilateral flank pain, nausea, vomiting  GU: no dysuria or gross hematuria  MSK:  No abnormalities  Neuro: no headache, no focal neurologic deficits  Skin: no abnormalities Psyche: negative.   Allergies  Penicillins  Home Medications   Current Outpatient Rx  Name  Route  Sig  Dispense  Refill  . IBUPROFEN 200 MG PO TABS   Oral  Take 400 mg by mouth every 6 (six) hours as needed. Pain         . ORTHO TRI-CYCLEN (28) PO   Oral   Take by mouth.         Marland Kitchen CIPROFLOXACIN HCL 500 MG PO TABS   Oral   Take 1 tablet (500 mg total) by mouth 2 (two) times daily.   20 tablet   0   . ONDANSETRON HCL 4 MG PO TABS   Oral   Take 1 tablet (4 mg total) by mouth every 6 (six) hours.   12 tablet   0   . OXYCODONE-ACETAMINOPHEN 5-325 MG PO TABS   Oral   Take 1 tablet by mouth every 6 (six) hours as needed for pain.   15 tablet   0     BP 137/77  Pulse 79  Temp 98.3 F (36.8 C) (Oral)  Resp 18  Ht 5\' 7"  (1.702 m)  Wt 238 lb (107.956 kg)  BMI 37.28 kg/m2  SpO2 99%  Physical Exam  Nursing note and vitals reviewed. Constitutional: She appears well-developed and well-nourished. No distress.  HENT:  Head: Normocephalic and atraumatic.  Eyes: Pupils are equal, round, and reactive to light.  Neck: Normal range of motion. Neck supple.  Cardiovascular: Normal rate and regular rhythm.   Pulmonary/Chest: Effort normal.  Abdominal: Soft. There is tenderness (bilateral flank tenderness). There is CVA tenderness.  Neurological: She is alert.  Skin: Skin is warm and dry.    ED Course  Procedures (including critical care time)  Labs Reviewed  CBC WITH DIFFERENTIAL - Abnormal; Notable for the following:    RBC 5.14 (*)     MCV 74.9 (*)     MCH 24.5 (*)     Neutro Abs 7.8 (*)     All other components within normal limits  COMPREHENSIVE METABOLIC PANEL - Abnormal; Notable for the following:    Glucose, Bld 104 (*)     Albumin 3.1 (*)     Total Bilirubin 0.2 (*)     All other components within normal limits  URINALYSIS, MICROSCOPIC ONLY - Abnormal; Notable for the following:    APPearance CLOUDY (*)     Hgb urine dipstick TRACE (*)     Leukocytes, UA LARGE (*)     Bacteria, UA MANY (*)     Squamous Epithelial / LPF MANY (*)     All other components within normal limits  LIPASE, BLOOD  POCT PREGNANCY, URINE   URINE CULTURE   No results found.   1. Pyelonephritis       MDM  Patient's blood work is negative for any abnormalities. Her urine however shows that she has a large urinary tract infection. Clinically and with this urine I believe that the patient has pyelonephritis. Urine culture has been sent out. The patient's vital signs remained stable. She's been treated with pain medication, fluids, IV Rocephin and now says that she feels much better and feels that she can go home. I am giving her prescription for Cipro for 10 days, Zofran, Percocet. Patient given strict return to the emergency department guidelines and has been asked to followup with her primary care Dr. on Monday.  Pt has been advised of the symptoms that warrant their return to the ED. Patient has voiced understanding and has agreed to follow-up with the PCP or specialist.         Dorthula Matas, PA 12/31/12 [phone]

## 2013-01-02 LAB — URINE CULTURE: Colony Count: 40000

## 2013-01-03 ENCOUNTER — Telehealth: Payer: Self-pay | Admitting: Internal Medicine

## 2013-01-03 NOTE — Telephone Encounter (Signed)
The patient called hoping to speak with the nurse about her antibiotic medication.  She states she her shoulder blades tighten after taking the recommended dose.  Her call back - [phone]

## 2013-01-03 NOTE — Telephone Encounter (Signed)
Spoke with patient who was given Cipro by the ED for a UTI. She states that she has notice a little tingling in should blades since starting and wanted to know if that could be a side effect. I advised that all medications has a possible side effect and cant rule that out. She states that its tolerable and therefore she will complete the regimen.

## 2013-02-20 ENCOUNTER — Other Ambulatory Visit: Payer: Self-pay | Admitting: Internal Medicine

## 2013-02-27 ENCOUNTER — Other Ambulatory Visit: Payer: Self-pay | Admitting: Internal Medicine

## 2013-04-04 ENCOUNTER — Telehealth: Payer: Self-pay | Admitting: *Deleted

## 2013-04-04 DIAGNOSIS — N921 Excessive and frequent menstruation with irregular cycle: Secondary | ICD-10-CM

## 2013-04-04 MED ORDER — LEVONORGESTREL-ETHINYL ESTRAD 0.15-30 MG-MCG PO TABS: 1.0000 | ORAL_TABLET | Freq: Every day | ORAL | Status: DC

## 2013-04-04 NOTE — Telephone Encounter (Signed)
Question about birth control.

## 2013-04-04 NOTE — Telephone Encounter (Signed)
Pt states she had a Nexplanon inserted in Jan. 2014 and has been bleeding for about 1 month now. Pt states she came into the office for a 6 week follow up and that day she started bleeding and has not stopped since. Pt states the bleeding is heavy and she has some slight cramping. Pt would like to know what she could do to stop the bleeding?

## 2013-04-04 NOTE — Telephone Encounter (Signed)
Pt advised Rx sent to the pharmacy for Nordette.

## 2013-06-13 ENCOUNTER — Other Ambulatory Visit (INDEPENDENT_AMBULATORY_CARE_PROVIDER_SITE_OTHER): Payer: 59 | Admitting: *Deleted

## 2013-06-13 ENCOUNTER — Other Ambulatory Visit: Payer: Self-pay | Admitting: Obstetrics & Gynecology

## 2013-06-13 VITALS — BP 117/80 | HR 83 | Temp 98.6°F | Wt 253.0 lb

## 2013-06-13 DIAGNOSIS — N390 Urinary tract infection, site not specified: Secondary | ICD-10-CM

## 2013-06-13 LAB — POCT URINALYSIS DIPSTICK
Ketones, UA: NEGATIVE
Spec Grav, UA: 1.015
Urobilinogen, UA: NEGATIVE

## 2013-06-13 NOTE — Progress Notes (Signed)
Pt c/o nausea, chills, body aches, and mild intermittent lower back pain x 3 to 4 days. Pt denies urinary urgency, frequency, odor, or pain. Pt has a history of UTI's and Pyelonephritis and was seen and treated x 5 months ago with Rocephin IV and Cipro after discharge. Pt last diagnosed with UTI x 3 months ago and treated with Keflex 500 mg bid x 7 days. 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 I advised pt I will send urine to lab for testing and speak with Dr. Tamela Oddi in reference to treatment. Pt advised if symptons worsen will need to follow up with PCP and possibly referral to Urologist.

## 2013-06-14 LAB — URINALYSIS, ROUTINE W REFLEX MICROSCOPIC
Glucose, UA: NEGATIVE mg/dL
Protein, ur: 100 mg/dL — AB
pH: 6.5 (ref 5.0–8.0)

## 2013-06-14 LAB — URINALYSIS, MICROSCOPIC ONLY
Bacteria, UA: NONE SEEN
Casts: NONE SEEN
Crystals: NONE SEEN
Squamous Epithelial / LPF: NONE SEEN

## 2013-06-15 ENCOUNTER — Telehealth: Payer: Self-pay | Admitting: *Deleted

## 2013-06-15 LAB — URINE CULTURE: Colony Count: NO GROWTH

## 2013-06-15 NOTE — Telephone Encounter (Signed)
Patient called regarding lab results from last visit.  Left message for patient to call back.

## 2013-07-02 ENCOUNTER — Ambulatory Visit: Payer: 59 | Admitting: Obstetrics & Gynecology

## 2013-07-24 ENCOUNTER — Encounter: Payer: Self-pay | Admitting: Obstetrics & Gynecology

## 2013-09-10 ENCOUNTER — Other Ambulatory Visit: Payer: Self-pay | Admitting: *Deleted

## 2013-09-10 MED ORDER — NITROFURANTOIN MONOHYD MACRO 100 MG PO CAPS: 100.0000 mg | ORAL_CAPSULE | Freq: Two times a day (BID) | ORAL | Status: DC

## 2013-10-24 LAB — HM PAP SMEAR: HM Pap smear: NORMAL

## 2013-11-05 ENCOUNTER — Ambulatory Visit: Payer: 59 | Admitting: Obstetrics & Gynecology

## 2013-11-07 ENCOUNTER — Encounter: Payer: Self-pay | Admitting: Obstetrics & Gynecology

## 2013-11-07 ENCOUNTER — Ambulatory Visit (INDEPENDENT_AMBULATORY_CARE_PROVIDER_SITE_OTHER): Payer: 59 | Admitting: Obstetrics & Gynecology

## 2013-11-07 VITALS — BP 119/74 | HR 73 | Temp 99.1°F | Ht 66.0 in | Wt 262.0 lb

## 2013-11-07 DIAGNOSIS — Z01419 Encounter for gynecological examination (general) (routine) without abnormal findings: Secondary | ICD-10-CM

## 2013-11-07 DIAGNOSIS — Z113 Encounter for screening for infections with a predominantly sexual mode of transmission: Secondary | ICD-10-CM

## 2013-11-07 NOTE — Addendum Note (Signed)
Addended by: George Hugh on: 11/07/2013 12:16 PM   Modules accepted: Orders

## 2013-11-07 NOTE — Patient Instructions (Signed)

## 2013-11-07 NOTE — Progress Notes (Signed)
Subjective:     Meredith Long is a 32 y.o. female here for a routine annual exam.  Current complaints: none at this time.  Personal health questionnaire reviewed: yes.   Gynecologic History Patient's last menstrual period was 10/24/2013. Contraception: Nexplanon Last Pap: June 2013. Results were: normal   Obstetric History OB History  No data available     The following portions of the patient's history were reviewed and updated as appropriate: allergies, current medications, past family history, past medical history, past social history, past surgical history and problem list.  Review of Systems Pertinent items are noted in HPI.    Objective:      General appearance: alert Breasts: normal appearance, no masses or tenderness Abdomen: soft, non-tender; bowel sounds normal; no masses,  no organomegaly Pelvic: cervix normal in appearance, external genitalia normal, no adnexal masses or tenderness, uterus normal size, shape, and consistency and vagina normal without discharge      Assessment:    Healthy female exam.    Plan:   Return prn

## 2013-11-08 LAB — GC/CHLAMYDIA PROBE AMP: GC Probe RNA: NEGATIVE

## 2013-11-08 LAB — RPR

## 2013-11-08 LAB — HIV ANTIBODY (ROUTINE TESTING W REFLEX): HIV: NONREACTIVE

## 2013-12-04 ENCOUNTER — Telehealth: Payer: Self-pay | Admitting: *Deleted

## 2013-12-04 NOTE — Telephone Encounter (Signed)
Patient states she has Nexplanon and is having some abnormal bleeding. She is requesting OCP to control her bleeding- she has done this in the past.

## 2013-12-05 NOTE — Telephone Encounter (Signed)
OK to prescribe for 2 months

## 2013-12-17 ENCOUNTER — Inpatient Hospital Stay (HOSPITAL_COMMUNITY): Payer: 59

## 2013-12-17 ENCOUNTER — Encounter (HOSPITAL_COMMUNITY)
Admission: AD | Disposition: A | Discharge status: Home / Self Care | Payer: Self-pay | Source: Ambulatory Visit | Attending: Obstetrics & Gynecology

## 2013-12-17 ENCOUNTER — Encounter (HOSPITAL_COMMUNITY): Payer: Self-pay | Admitting: *Deleted

## 2013-12-17 ENCOUNTER — Inpatient Hospital Stay (HOSPITAL_COMMUNITY): Payer: 59 | Admitting: Anesthesiology

## 2013-12-17 ENCOUNTER — Encounter (HOSPITAL_COMMUNITY): Payer: 59 | Admitting: Anesthesiology

## 2013-12-17 ENCOUNTER — Inpatient Hospital Stay (HOSPITAL_COMMUNITY)
Admission: AD | Admit: 2013-12-17 | Discharge: 2013-12-19 | DRG: 742 | Disposition: A | Discharge status: Home / Self Care | Payer: 59 | Source: Ambulatory Visit | Attending: Obstetrics & Gynecology | Admitting: Obstetrics & Gynecology

## 2013-12-17 DIAGNOSIS — K219 Gastro-esophageal reflux disease without esophagitis: Secondary | ICD-10-CM | POA: Diagnosis present

## 2013-12-17 DIAGNOSIS — R1900 Intra-abdominal and pelvic swelling, mass and lump, unspecified site: Secondary | ICD-10-CM | POA: Diagnosis present

## 2013-12-17 DIAGNOSIS — N8353 Torsion of ovary, ovarian pedicle and fallopian tube: Secondary | ICD-10-CM | POA: Diagnosis present

## 2013-12-17 DIAGNOSIS — N949 Unspecified condition associated with female genital organs and menstrual cycle: Secondary | ICD-10-CM | POA: Diagnosis present

## 2013-12-17 DIAGNOSIS — N83209 Unspecified ovarian cyst, unspecified side: Secondary | ICD-10-CM | POA: Diagnosis present

## 2013-12-17 DIAGNOSIS — D279 Benign neoplasm of unspecified ovary: Principal | ICD-10-CM | POA: Diagnosis present

## 2013-12-17 DIAGNOSIS — K8020 Calculus of gallbladder without cholecystitis without obstruction: Secondary | ICD-10-CM | POA: Diagnosis present

## 2013-12-17 HISTORY — DX: Herpesviral infection, unspecified: B00.9

## 2013-12-17 HISTORY — DX: Renal tubulo-interstitial disease, unspecified: N15.9

## 2013-12-17 HISTORY — PX: SALPINGOOPHORECTOMY: SHX82

## 2013-12-17 HISTORY — PX: OVARIAN CYST REMOVAL: SHX89

## 2013-12-17 HISTORY — PX: LAPAROTOMY: SHX154

## 2013-12-17 LAB — COMPREHENSIVE METABOLIC PANEL
ALT: 10 U/L (ref 0–35)
AST: 16 U/L (ref 0–37)
Alkaline Phosphatase: 84 U/L (ref 39–117)
CO2: 24 mEq/L (ref 19–32)
Chloride: 101 mEq/L (ref 96–112)
GFR calc non Af Amer: >90 mL/min (ref >90)
Glucose, Bld: 108 mg/dL — ABNORMAL HIGH (ref 70–99)
Sodium: 136 mEq/L (ref 135–145)
Total Bilirubin: 0.6 mg/dL (ref 0.3–1.2)
Total Protein: 7.3 g/dL (ref 6.0–8.3)

## 2013-12-17 LAB — CBC
MCHC: 33.2 g/dL (ref 30.0–36.0)
Platelets: 241 10*3/uL (ref 150–400)
RDW: 15 % (ref 11.5–15.5)
WBC: 9.8 10*3/uL (ref 4.0–10.5)

## 2013-12-17 LAB — URINALYSIS, ROUTINE W REFLEX MICROSCOPIC
Ketones, ur: NEGATIVE mg/dL
Nitrite: NEGATIVE
Protein, ur: NEGATIVE mg/dL
Urobilinogen, UA: 0.2 mg/dL (ref 0.0–1.0)

## 2013-12-17 LAB — TYPE AND SCREEN
ABO/RH(D): AB POS
Antibody Screen: NEGATIVE

## 2013-12-17 LAB — CA 125: CA 125: 4.1 U/mL (ref 0.0–30.2)

## 2013-12-17 LAB — URINE MICROSCOPIC-ADD ON

## 2013-12-17 SURGERY — LAPAROTOMY, EXPLORATORY
Anesthesia: General | Site: Abdomen | Laterality: Right

## 2013-12-17 MED ORDER — SUCCINYLCHOLINE CHLORIDE 20 MG/ML IJ SOLN
INTRAMUSCULAR | Status: DC | PRN
  Administered 2013-12-17: 200 mg via INTRAVENOUS

## 2013-12-17 MED ORDER — ROCURONIUM BROMIDE 100 MG/10ML IV SOLN
INTRAVENOUS | Status: DC | PRN
  Administered 2013-12-17: 20 mg via INTRAVENOUS

## 2013-12-17 MED ORDER — HYDROMORPHONE 0.3 MG/ML IV SOLN
INTRAVENOUS | Status: DC
  Administered 2013-12-17: via INTRAVENOUS
  Administered 2013-12-18: 0.6 mg via INTRAVENOUS
  Administered 2013-12-18: 1.2 mg via INTRAVENOUS
  Administered 2013-12-18: 1.5 mg via INTRAVENOUS
  Filled 2013-12-17: qty 25

## 2013-12-17 MED ORDER — MAGNESIUM HYDROXIDE 400 MG/5ML PO SUSP
30.0000 mL | Freq: Three times a day (TID) | ORAL | Status: AC
  Filled 2013-12-17: qty 30

## 2013-12-17 MED ORDER — HYDROMORPHONE HCL PF 1 MG/ML IJ SOLN
1.0000 mg | Freq: Once | INTRAMUSCULAR | Status: AC
  Administered 2013-12-17: 1 mg via INTRAVENOUS
  Filled 2013-12-17: qty 1

## 2013-12-17 MED ORDER — ENSURE COMPLETE PO LIQD
237.0000 mL | Freq: Two times a day (BID) | ORAL | Status: DC
  Filled 2013-12-17 (×4): qty 237

## 2013-12-17 MED ORDER — DEXAMETHASONE SODIUM PHOSPHATE 4 MG/ML IJ SOLN
INTRAMUSCULAR | Status: DC | PRN
  Administered 2013-12-17: 10 mg via INTRAVENOUS

## 2013-12-17 MED ORDER — OXYCODONE-ACETAMINOPHEN 5-325 MG PO TABS
1.0000 | ORAL_TABLET | ORAL | Status: DC | PRN
  Administered 2013-12-18 (×2): 1 via ORAL
  Administered 2013-12-18: 2 via ORAL
  Administered 2013-12-19: 1 via ORAL
  Administered 2013-12-19: 2 via ORAL
  Filled 2013-12-17: qty 2
  Filled 2013-12-17 (×3): qty 1
  Filled 2013-12-17 (×2): qty 2

## 2013-12-17 MED ORDER — NALOXONE HCL 0.4 MG/ML IJ SOLN: 0.4000 mg | INTRAMUSCULAR | Status: DC | PRN

## 2013-12-17 MED ORDER — METOCLOPRAMIDE HCL 5 MG/ML IJ SOLN: 10.0000 mg | Freq: Once | INTRAMUSCULAR | Status: DC | PRN

## 2013-12-17 MED ORDER — IOHEXOL 300 MG/ML  SOLN
100.0000 mL | Freq: Once | INTRAMUSCULAR | Status: AC | PRN
  Administered 2013-12-17: 100 mL via INTRAVENOUS

## 2013-12-17 MED ORDER — LIDOCAINE HCL (CARDIAC) 20 MG/ML IV SOLN
INTRAVENOUS | Status: DC | PRN
  Administered 2013-12-17: 50 mg via INTRAVENOUS

## 2013-12-17 MED ORDER — ZOLPIDEM TARTRATE 5 MG PO TABS: 5.0000 mg | ORAL_TABLET | Freq: Every evening | ORAL | Status: DC | PRN

## 2013-12-17 MED ORDER — FAMOTIDINE IN NACL 20-0.9 MG/50ML-% IV SOLN
20.0000 mg | Freq: Once | INTRAVENOUS | Status: AC
  Administered 2013-12-17: 20 mg via INTRAVENOUS
  Filled 2013-12-17: qty 50

## 2013-12-17 MED ORDER — HYDROMORPHONE HCL PF 1 MG/ML IJ SOLN
0.2500 mg | INTRAMUSCULAR | Status: DC | PRN
  Administered 2013-12-17 (×2): 0.5 mg via INTRAVENOUS

## 2013-12-17 MED ORDER — PROMETHAZINE HCL 25 MG/ML IJ SOLN
25.0000 mg | Freq: Once | INTRAMUSCULAR | Status: AC
  Administered 2013-12-17: 25 mg via INTRAVENOUS
  Filled 2013-12-17: qty 1

## 2013-12-17 MED ORDER — ONDANSETRON HCL 4 MG/2ML IJ SOLN
4.0000 mg | Freq: Four times a day (QID) | INTRAMUSCULAR | Status: DC | PRN
  Administered 2013-12-18: 4 mg via INTRAVENOUS
  Filled 2013-12-17: qty 2

## 2013-12-17 MED ORDER — GLYCOPYRROLATE 0.2 MG/ML IJ SOLN
INTRAMUSCULAR | Status: DC | PRN
  Administered 2013-12-17: .8 mg via INTRAVENOUS

## 2013-12-17 MED ORDER — SODIUM CHLORIDE 0.9 % IJ SOLN: 9.0000 mL | INTRAMUSCULAR | Status: DC | PRN

## 2013-12-17 MED ORDER — NEOSTIGMINE METHYLSULFATE 1 MG/ML IJ SOLN
INTRAMUSCULAR | Status: DC | PRN
  Administered 2013-12-17: 4 mg via INTRAVENOUS

## 2013-12-17 MED ORDER — FENTANYL CITRATE 0.05 MG/ML IJ SOLN
INTRAMUSCULAR | Status: AC
  Filled 2013-12-17: qty 2

## 2013-12-17 MED ORDER — IOHEXOL 300 MG/ML  SOLN
50.0000 mL | INTRAMUSCULAR | Status: DC
  Administered 2013-12-17: 50 mL via ORAL

## 2013-12-17 MED ORDER — LACTATED RINGERS IV SOLN
INTRAVENOUS | Status: DC
  Administered 2013-12-17: 50 mL/h via INTRAVENOUS
  Administered 2013-12-17 (×2): via INTRAVENOUS

## 2013-12-17 MED ORDER — MIDAZOLAM HCL 5 MG/5ML IJ SOLN
INTRAMUSCULAR | Status: DC | PRN
  Administered 2013-12-17: 2 mg via INTRAVENOUS

## 2013-12-17 MED ORDER — KETOROLAC TROMETHAMINE 30 MG/ML IJ SOLN
15.0000 mg | Freq: Four times a day (QID) | INTRAMUSCULAR | Status: DC
  Administered 2013-12-18 (×2): 15 mg via INTRAVENOUS
  Filled 2013-12-17 (×2): qty 1

## 2013-12-17 MED ORDER — FENTANYL CITRATE 0.05 MG/ML IJ SOLN
25.0000 ug | INTRAMUSCULAR | Status: DC | PRN
  Administered 2013-12-17 (×3): 50 ug via INTRAVENOUS

## 2013-12-17 MED ORDER — DIPHENHYDRAMINE HCL 12.5 MG/5ML PO ELIX: 12.5000 mg | ORAL_SOLUTION | Freq: Four times a day (QID) | ORAL | Status: DC | PRN

## 2013-12-17 MED ORDER — KCL IN DEXTROSE-NACL 20-5-0.45 MEQ/L-%-% IV SOLN
INTRAVENOUS | Status: DC
Start: 2013-12-17 — End: 2013-12-19
  Administered 2013-12-18 (×2): via INTRAVENOUS
  Filled 2013-12-17 (×4): qty 1000

## 2013-12-17 MED ORDER — KETOROLAC TROMETHAMINE 30 MG/ML IJ SOLN
25.0000 mg | Freq: Four times a day (QID) | INTRAMUSCULAR | Status: DC
  Administered 2013-12-17: 25 mg via INTRAVENOUS

## 2013-12-17 MED ORDER — DIPHENHYDRAMINE HCL 50 MG/ML IJ SOLN: 12.5000 mg | Freq: Four times a day (QID) | INTRAMUSCULAR | Status: DC | PRN

## 2013-12-17 MED ORDER — FENTANYL CITRATE 0.05 MG/ML IJ SOLN
INTRAMUSCULAR | Status: DC | PRN
  Administered 2013-12-17: 100 ug via INTRAVENOUS
  Administered 2013-12-17: 50 ug via INTRAVENOUS
  Administered 2013-12-17: 100 ug via INTRAVENOUS

## 2013-12-17 MED ORDER — KETOROLAC TROMETHAMINE 30 MG/ML IJ SOLN
INTRAMUSCULAR | Status: AC
  Filled 2013-12-17: qty 1

## 2013-12-17 MED ORDER — PROPOFOL 10 MG/ML IV BOLUS
INTRAVENOUS | Status: DC | PRN
  Administered 2013-12-17: 200 mg via INTRAVENOUS

## 2013-12-17 MED ORDER — ONDANSETRON HCL 4 MG/2ML IJ SOLN
INTRAMUSCULAR | Status: DC | PRN
  Administered 2013-12-17: 4 mg via INTRAVENOUS

## 2013-12-17 MED ORDER — ONDANSETRON HCL 4 MG PO TABS: 4.0000 mg | ORAL_TABLET | Freq: Four times a day (QID) | ORAL | Status: DC | PRN

## 2013-12-17 MED ORDER — MEPERIDINE HCL 25 MG/ML IJ SOLN: 6.2500 mg | INTRAMUSCULAR | Status: DC | PRN

## 2013-12-17 MED ORDER — FENTANYL CITRATE 0.05 MG/ML IJ SOLN
INTRAMUSCULAR | Status: AC
  Administered 2013-12-17: 50 ug via INTRAVENOUS
  Filled 2013-12-17: qty 2

## 2013-12-17 MED ORDER — KETOROLAC TROMETHAMINE 30 MG/ML IJ SOLN: 15.0000 mg | Freq: Four times a day (QID) | INTRAMUSCULAR | Status: DC

## 2013-12-17 MED ORDER — HYDROMORPHONE HCL PF 1 MG/ML IJ SOLN
INTRAMUSCULAR | Status: AC
  Administered 2013-12-17: 0.5 mg via INTRAVENOUS
  Filled 2013-12-17: qty 1

## 2013-12-17 MED ORDER — HYDROMORPHONE HCL PF 1 MG/ML IJ SOLN
INTRAMUSCULAR | Status: DC | PRN
  Administered 2013-12-17: 1 mg via INTRAVENOUS

## 2013-12-17 SURGICAL SUPPLY — 47 items
BENZOIN TINCTURE PRP APPL 2/3 (GAUZE/BANDAGES/DRESSINGS) ×4 IMPLANT
CANISTER SUCT 3000ML (MISCELLANEOUS) ×4 IMPLANT
CELLS DAT CNTRL 66122 CELL SVR (MISCELLANEOUS) IMPLANT
CHLORAPREP W/TINT 26ML (MISCELLANEOUS) ×8 IMPLANT
CLOTH BEACON ORANGE TIMEOUT ST (SAFETY) ×4 IMPLANT
CONT PATH 16OZ SNAP LID 3702 (MISCELLANEOUS) ×4 IMPLANT
CONTAINER PREFILL 10% NBF 60ML (FORM) IMPLANT
DECANTER SPIKE VIAL GLASS SM (MISCELLANEOUS) IMPLANT
DRAPE UNDERBUTTOCKS STRL (DRAPE) ×4 IMPLANT
DRAPE WARM FLUID 44X44 (DRAPE) IMPLANT
DRSG OPSITE POSTOP 4X10 (GAUZE/BANDAGES/DRESSINGS) ×4 IMPLANT
GAUZE SPONGE 4X4 16PLY XRAY LF (GAUZE/BANDAGES/DRESSINGS) ×4 IMPLANT
GLOVE BIO SURGEON STRL SZ 6.5 (GLOVE) ×4 IMPLANT
GOWN PREVENTION PLUS LG XLONG (DISPOSABLE) ×12 IMPLANT
IV LACTATED RINGERS 1000ML (IV SOLUTION) ×4 IMPLANT
NEEDLE HYPO 25X1 1.5 SAFETY (NEEDLE) IMPLANT
NS IRRIG 1000ML POUR BTL (IV SOLUTION) ×4 IMPLANT
PACK ABDOMINAL GYN (CUSTOM PROCEDURE TRAY) ×4 IMPLANT
PAD MAGNETIC INST (MISCELLANEOUS) ×4 IMPLANT
PAD OB MATERNITY 4.3X12.25 (PERSONAL CARE ITEMS) ×4 IMPLANT
RETRACTOR WND ALEXIS 25 LRG (MISCELLANEOUS) IMPLANT
RTRCTR WOUND ALEXIS 18CM MED (MISCELLANEOUS)
RTRCTR WOUND ALEXIS 25CM LRG (MISCELLANEOUS)
SCRUB PCMX 4 OZ (MISCELLANEOUS) IMPLANT
SEPRAFILM MEMBRANE 5X6 (MISCELLANEOUS) IMPLANT
SHEET LAVH (DRAPES) ×4 IMPLANT
SPONGE GAUZE 4X4 12PLY STER LF (GAUZE/BANDAGES/DRESSINGS) IMPLANT
SPONGE LAP 18X18 X RAY DECT (DISPOSABLE) ×4 IMPLANT
STAPLER VISISTAT 35W (STAPLE) ×4 IMPLANT
STRIP CLOSURE SKIN 1/2X4 (GAUZE/BANDAGES/DRESSINGS) ×4 IMPLANT
SUT MNCRL AB 3-0 PS2 27 (SUTURE) IMPLANT
SUT MON AB 2-0 CT1 27 (SUTURE) ×4 IMPLANT
SUT MON AB 3-0 SH 27 (SUTURE) ×1
SUT MON AB 3-0 SH27 (SUTURE) ×3 IMPLANT
SUT PDS AB 0 CT 36 (SUTURE) ×8 IMPLANT
SUT PDS AB 0 CTX 60 (SUTURE) IMPLANT
SUT VIC AB 0 CT1 27 (SUTURE) ×2
SUT VIC AB 0 CT1 27XBRD ANBCTR (SUTURE) ×6 IMPLANT
SUT VIC AB 1 CT1 27 (SUTURE)
SUT VIC AB 1 CT1 27XBRD ANTBC (SUTURE) IMPLANT
SUT VIC AB 2-0 CT1 27 (SUTURE) ×2
SUT VIC AB 2-0 CT1 TAPERPNT 27 (SUTURE) ×6 IMPLANT
SUT VICRYL 1 TIES 12X18 (SUTURE) ×4 IMPLANT
SYR CONTROL 10ML LL (SYRINGE) IMPLANT
TOWEL OR 17X24 6PK STRL BLUE (TOWEL DISPOSABLE) ×8 IMPLANT
TRAY FOLEY CATH 14FR (SET/KITS/TRAYS/PACK) ×4 IMPLANT
WATER STERILE IRR 1000ML POUR (IV SOLUTION) ×4 IMPLANT

## 2013-12-17 NOTE — MAU Note (Signed)
Pain in RLQ started this morning, side down in to pelvis upper leg.  Like a bad cramp.  Became nauseated while at desk.

## 2013-12-17 NOTE — Progress Notes (Signed)
Patient states she feels uncomfortable after U/S and requests pain med. Zorita Pang, CNM notified.

## 2013-12-17 NOTE — Op Note (Signed)
Hysterectomy Procedure Note  Indications: Bilateral pelvic masses, acute abdominal pain  Pre-operative Diagnosis: Bilateral pelvic masses, acute abdominal pain.  R/O adnexal torsion  Post-operative Diagnosis: Bilateral ovarian cyst, right adnexal torsion  Operation: Exploratory laparotomy, right salpingo oophorectomy, left ovarian cystectom Surgeon: Roseanna Rainbow   Assistants: Francoise Ceo  Anesthesia: General endotracheal anesthesia    Procedure Details  The patient was seen in the Holding Room. The risks, benefits, complications, treatment options, and expected outcomes were discussed with the patient.  The patient concurred with the proposed plan, giving informed consent.  The site of surgery properly noted/marked. The patient was taken to Operating Room # 4, identified as Meredith Long and the procedure verified as exploratory laparotomy, possible oophorectomy, possible ovarian cystectomy. A Time Out was held and the above information confirmed.  Patient brought to the operating room and after satisfactory attainment of general anesthesia was placed in a modified lithotomy position in [place] stirrups. Anterior abdominal wall perineum and vagina were prepped a Foley catheter was inserted the patient was draped.  The abdomen was entered through a transverse incision. The upper abdomen and the pelvis were explored with the findings noted below.   The right adnexa was untwisted.  The right infundibulo-pelvic ligament, uteroovarian ligament and proximal fallopian was clamped, cut and suture ligated with 0-Vicryl. Two free ligatures were placed around the pedicle as well.   Attention was turned to the left side of the pelvis. The left ovarian cortex was incised circumferentially over the cyst wall.  The cyst was then enucleated using sharp and blunt dissection.  The cyst was removed intact. The cyst bed was oversewn with a running locked suture of 2 - 0 Monocryl.  The ovarian cortex  was re approximated with a running suture of 2 - 0 Monocryl.  Adequate hemostasis was noted.  The pelvis was then irrigated.  The parieto peritoneum was closed with a running suture of 2 - 0 Vicryl.  The subcutaneous tissue was irrigated and hemostasis was achieved with cautery.  The skin closed with suture. A dressing was applied. The patient was awakened from anesthesia and taken to recovery in satisfactory condition.  Sponge needle and instrument counts correct x2   Findings:  The right ovary was twisted on its blood supply x 3.  The right ovary had venous engorgement and was dusky.  It measured approximately 8 cm in diameter with cystic and solid areas.  The cortex was smooth walled.  The left of ovary had a multiloculated cyst measuring approximately 12 cm in diameter.  The cyst wall was smooth. The contents appeared to serous fluid.   Estimated Blood Loss:  less than 100 mL          Total IV Fluids: per Anesthesiology         Specimens: Right ovary, distal right fallopian tube; left ovarian cyst          Complications:  None; patient tolerated the procedure well.         Disposition: PACU - hemodynamically stable.         Condition: stable

## 2013-12-17 NOTE — Anesthesia Procedure Notes (Addendum)
Performed by: Marrion Coy R   Procedure Name: Intubation Date/Time: 12/17/2013 8:02 PM Performed by: Marrion Coy R Pre-anesthesia Checklist: Patient identified, Timeout performed, Emergency Drugs available, Suction available and Patient being monitored Patient Re-evaluated:Patient Re-evaluated prior to inductionOxygen Delivery Method: Circle system utilized Preoxygenation: Pre-oxygenation with 100% oxygen Intubation Type: IV induction, Rapid sequence and Cricoid Pressure applied Laryngoscope Size: Mac and 3 Tube size: 7.0 mm Number of attempts: 1 Airway Equipment and Method: Stylet

## 2013-12-17 NOTE — Anesthesia Postprocedure Evaluation (Signed)
  Anesthesia Post-op Note  Patient: Meredith Long  Procedure(s) Performed: Procedure(s): EXPLORATORY LAPAROTOMY (N/A) SALPINGO OOPHORECTOMY (Right) OVARIAN CYSTECTOMY (Left)  Patient Location: PACU  Anesthesia Type:General  Level of Consciousness: awake, alert  and oriented  Airway and Oxygen Therapy: Patient Spontanous Breathing  Post-op Pain: mild  Post-op Assessment: Post-op Vital signs reviewed, Patient's Cardiovascular Status Stable, Respiratory Function Stable, Patent Airway, No signs of Nausea or vomiting and Pain level controlled  Post-op Vital Signs: Reviewed and stable  Complications: No apparent anesthesia complications

## 2013-12-17 NOTE — MAU Provider Note (Signed)
History     CSN: [id number]  Arrival date and time: 12/17/13 4098   First Provider Initiated Contact with Patient 12/17/13 0932      Chief Complaint  Patient presents with  . Abdominal Pain  . Nausea   Abdominal Pain    Meredith Long is a 32 y.o. [id number] who presents today with sudden onset RLQ pain. She states that the pain started this morning, and she had nausea as well. She has been vomiting since she arrived here. She rates the pain 10/10.   She denies any fever or any close contacts who have recently been sick. She denies diarrhea or constipation.    Past Medical History  Diagnosis Date  . GERD (gastroesophageal reflux disease)   . Kidney infection     Past Surgical History  Procedure Laterality Date  . Dilation and curettage of uterus      Family History  Problem Relation Age of Onset  . Hyperlipidemia Other   . Hypertension Other   . Diabetes Other   . Hypertension Mother     History  Substance Use Topics  . Smoking status: Never Smoker   . Smokeless tobacco: Never Used  . Alcohol Use: No    Allergies:  Allergies  Allergen Reactions  . Penicillins Other (See Comments)    Does not know    No prescriptions prior to admission    Review of Systems  Gastrointestinal: Positive for abdominal pain.   Physical Exam   Blood pressure 123/64, pulse 83, temperature 98.8 F (37.1 C), temperature source Oral, resp. rate 20, height 5' 5.5" (1.664 m), weight 122.471 kg (270 lb).  Physical Exam  Nursing note and vitals reviewed. Constitutional: She is oriented to person, place, and time. She appears well-developed and well-nourished. She appears distressed.  Cardiovascular: Normal rate.   Respiratory: Effort normal and breath sounds normal.  GI: Soft. There is tenderness (RLQ). There is rebound (RLQ).  Neurological: She is alert and oriented to person, place, and time.  Skin: Skin is warm.  Psychiatric: She has a normal mood and affect.    MAU  Course  Procedures  Results for orders placed during the hospital encounter of 12/17/13 (from the past 24 hour(s))  URINALYSIS, ROUTINE W REFLEX MICROSCOPIC     Status: Abnormal   Collection Time    12/17/13  9:10 AM      Result Value Range   Color, Urine YELLOW  YELLOW   APPearance CLEAR  CLEAR   Specific Gravity, Urine 1.025  1.005 - 1.030   pH 6.0  5.0 - 8.0   Glucose, UA NEGATIVE  NEGATIVE mg/dL   Hgb urine dipstick SMALL (*) NEGATIVE   Bilirubin Urine NEGATIVE  NEGATIVE   Ketones, ur NEGATIVE  NEGATIVE mg/dL   Protein, ur NEGATIVE  NEGATIVE mg/dL   Urobilinogen, UA 0.2  0.0 - 1.0 mg/dL   Nitrite NEGATIVE  NEGATIVE   Leukocytes, UA SMALL (*) NEGATIVE  URINE MICROSCOPIC-ADD ON     Status: Abnormal   Collection Time    12/17/13  9:10 AM      Result Value Range   Squamous Epithelial / LPF FEW (*) RARE   WBC, UA 3-6  <3 WBC/hpf   RBC / HPF 7-10  <3 RBC/hpf   Bacteria, UA RARE  RARE   Urine-Other MUCOUS PRESENT    POCT PREGNANCY, URINE     Status: None   Collection Time    12/17/13  9:28 AM  Result Value Range   Preg Test, Ur NEGATIVE  NEGATIVE  CBC     Status: Abnormal   Collection Time    12/17/13  9:33 AM      Result Value Range   WBC 9.8  4.0 - 10.5 K/uL   RBC 5.05  3.87 - 5.11 MIL/uL   Hemoglobin 12.9  12.0 - 15.0 g/dL   HCT 16.1  09.6 - 04.5 %   MCV 76.8 (*) 78.0 - 100.0 fL   MCH 25.5 (*) 26.0 - 34.0 pg   MCHC 33.2  30.0 - 36.0 g/dL   RDW 40.9  81.1 - 91.4 %   Platelets 241  150 - 400 K/uL  COMPREHENSIVE METABOLIC PANEL     Status: Abnormal   Collection Time    12/17/13  9:33 AM      Result Value Range   Sodium 136  135 - 145 mEq/L   Potassium 3.3 (*) 3.5 - 5.1 mEq/L   Chloride 101  96 - 112 mEq/L   CO2 24  19 - 32 mEq/L   Glucose, Bld 108 (*) 70 - 99 mg/dL   BUN 8  6 - 23 mg/dL   Creatinine, Ser 7.82  0.50 - 1.10 mg/dL   Calcium 8.6  8.4 - 95.6 mg/dL   Total Protein 7.3  6.0 - 8.3 g/dL   Albumin 3.3 (*) 3.5 - 5.2 g/dL   AST 16  0 - 37 U/L   ALT  10  0 - 35 U/L   Alkaline Phosphatase 84  39 - 117 U/L   Total Bilirubin 0.6  0.3 - 1.2 mg/dL   GFR calc non Af Amer >90  >90 mL/min   GFR calc Af Amer >90  >90 mL/min   Ct Abdomen Pelvis W Contrast  12/17/2013   CLINICAL DATA:  Early onset right lower quadrant pain down in the pelvis.  EXAM: CT ABDOMEN AND PELVIS WITH CONTRAST  TECHNIQUE: Multidetector CT imaging of the abdomen and pelvis was performed using the standard protocol following bolus administration of intravenous contrast.  CONTRAST:  OMNIPAQUE IOHEXOL 300 MG/ML  SOLN  COMPARISON:  None available  FINDINGS: Mild subsegmental atelectasis is seen dependently within the lung bases bilaterally. Visualized heart is within normal limits.  The liver demonstrates a normal contrast enhanced appearance. Several mildly hyperdense stones are seen within the gallbladder. No CT evidence of acute cholecystitis. No biliary ductal dilatation.  The spleen, adrenal glands, and pancreas demonstrate a normal contrast enhanced appearance.  The kidneys are equal in size with symmetric enhancement. No nephrolithiasis, hydronephrosis, or focal enhancing renal mass identified.  Stomach is within normal limits. There is no evidence of bowel obstruction. The appendix is well visualized in the right lower quadrant and is of normal caliber and appearance without associated inflammatory changes to suggest acute appendicitis. No abnormal wall thickening or inflammatory fat stranding seen about the bowels.  A complex mass is seen within the right adnexal arising from the right ovary. This lesion measures approximately 11.8 x 4.8 x 5.5 cm (series 2, image 68). There is internal fat and calcific density within this lesion. Finding is most suggestive of a dermoid. No free fluid or inflammatory fat stranding seen within the right adnexa to suggest associated right ovarian torsion.  An additional predominantly hypodense unilocular mass measuring 7.0 x 10.8 x 13.2 cm is seen  arising from the left ovary (series 2, image 56). A 7 mm calcific density is seen internally within this lesion (  image 64). Focal hypodensity adjacent to this calcification may represent a small amount of fat versus artifact. 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  The bladder is displaced inferiorly within the lower pelvis but is otherwise unremarkable. Uterus is within normal limits.  No free air or fluid identified.  No pathologically enlarged intra-abdominal pelvic lymph nodes.  Osseous structures are within normal limits. No focal lytic or blastic osseous lesions. Sacralization of the left aspect of L5 is noted.  The left ovary is within normal limits.  IMPRESSION: 1. Complex right adnexal mass with internal fat and calcific density, most consistent with a dermoid. No CT evidence of associated ovarian torsion. 2. Hypodense unilocular left ovarian mass with internal calcification as above. 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 No CT evidence of associated ovarian torsion. These findings could be further evaluated with pelvic MRI as clinically indicated. 3. No CT evidence of acute intra-abdominal or pelvic process. Normal appendix. 4. Cholelithiasis without evidence of acute cholecystitis.   Electronically Signed   By: Rise Mu M.D.   On: 12/17/2013 13:13   1618: D/W Dr. Tamela Oddi. Will keep the patient NPO, and she will be here to see her after office hours.  1841: Dr. Tamela Oddi assumes care of the patient.  Assessment and Plan  Ovarian mass   Tawnya Crook 12/17/2013, 9:35 AM

## 2013-12-17 NOTE — Anesthesia Preprocedure Evaluation (Signed)
Anesthesia Evaluation  Patient identified by MRN, date of birth, ID band Patient awake    Reviewed: Allergy & Precautions, H&P , NPO status , Patient's Chart, lab work & pertinent test results  Airway Mallampati: III TM Distance: >3 FB Neck ROM: Full    Dental no notable dental hx. (+) Teeth Intact   Pulmonary neg pulmonary ROS,  breath sounds clear to auscultation  Pulmonary exam normal       Cardiovascular negative cardio ROS  Rhythm:Regular Rate:Normal     Neuro/Psych negative neurological ROS  negative psych ROS   GI/Hepatic Neg liver ROS, GERD-  ,  Endo/Other  Morbid obesity  Renal/GU Renal diseaseHx/o kidney infections  negative genitourinary   Musculoskeletal negative musculoskeletal ROS (+)   Abdominal (+) + obese,   Peds  Hematology negative hematology ROS (+)   Anesthesia Other Findings   Reproductive/Obstetrics Bilateral Adnexal Masses                           Anesthesia Physical Anesthesia Plan  ASA: III and emergent  Anesthesia Plan: General   Post-op Pain Management:    Induction: Intravenous, Rapid sequence and Cricoid pressure planned  Airway Management Planned: Oral ETT  Additional Equipment:   Intra-op Plan:   Post-operative Plan: Extubation in OR  Informed Consent: I have reviewed the patients History and Physical, chart, labs and discussed the procedure including the risks, benefits and alternatives for the proposed anesthesia with the patient or authorized representative who has indicated his/her understanding and acceptance.   Dental advisory given  Plan Discussed with: CRNA, Anesthesiologist and Surgeon  Anesthesia Plan Comments:         Anesthesia Quick Evaluation

## 2013-12-17 NOTE — H&P (Signed)
History   CSN: [id number]  Arrival date and time: 12/17/13 4098  First Provider Initiated Contact with Patient 12/17/13 0932  Chief Complaint   Patient presents with   .  Abdominal Pain   .  Nausea   Abdominal Pain  Meredith Long is a 32 y.o. [id number] who presents today with sudden onset RLQ pain. She states that the pain started this morning, and she had nausea as well. She has been vomiting since she arrived here. She rates the pain 10/10.  She denies any fever or any close contacts who have recently been sick. She denies diarrhea or constipation.  Past Medical History   Diagnosis  Date   .  GERD (gastroesophageal reflux disease)    .  Kidney infection     Past Surgical History   Procedure  Laterality  Date   .  Dilation and curettage of uterus      Family History   Problem  Relation  Age of Onset   .  Hyperlipidemia  Other    .  Hypertension  Other    .  Diabetes  Other    .  Hypertension  Mother     History   Substance Use Topics   .  Smoking status:  Never Smoker   .  Smokeless tobacco:  Never Used   .  Alcohol Use:  No   Allergies:  Allergies   Allergen  Reactions   .  Penicillins  Other (See Comments)     Does not know    No prescriptions prior to admission   Review of Systems  Gastrointestinal: Positive for abdominal pain.  Physical Exam   Blood pressure 123/64, pulse 83, temperature 98.8 F (37.1 C), temperature source Oral, resp. rate 20, height 5' 5.5" (1.664 m), weight 122.471 kg (270 lb).  Physical Exam  Nursing note and vitals reviewed.  Constitutional: She is oriented to person, place, and time. She appears well-developed and well-nourished. She appears distressed.  Cardiovascular: Normal rate.  Respiratory: Effort normal and breath sounds normal.  GI: Soft. There is tenderness (RLQ). There is rebound (RLQ).  Neurological: She is alert and oriented to person, place, and time.  Skin: Skin is warm.  Psychiatric: She has a normal mood and affect.  MAU  Course   Procedures  Results for orders placed during the hospital encounter of 12/17/13 (from the past 24 hour(s))   URINALYSIS, ROUTINE W REFLEX MICROSCOPIC Status: Abnormal    Collection Time    12/17/13 9:10 AM   Result  Value  Range    Color, Urine  YELLOW  YELLOW    APPearance  CLEAR  CLEAR    Specific Gravity, Urine  1.025  1.005 - 1.030    pH  6.0  5.0 - 8.0    Glucose, UA  NEGATIVE  NEGATIVE mg/dL    Hgb urine dipstick  SMALL (*)  NEGATIVE    Bilirubin Urine  NEGATIVE  NEGATIVE    Ketones, ur  NEGATIVE  NEGATIVE mg/dL    Protein, ur  NEGATIVE  NEGATIVE mg/dL    Urobilinogen, UA  0.2  0.0 - 1.0 mg/dL    Nitrite  NEGATIVE  NEGATIVE    Leukocytes, UA  SMALL (*)  NEGATIVE   URINE MICROSCOPIC-ADD ON Status: Abnormal    Collection Time    12/17/13 9:10 AM   Result  Value  Range    Squamous Epithelial / LPF  FEW (*)  RARE    WBC, UA  3-6  <3 WBC/hpf    RBC / HPF  7-10  <3 RBC/hpf    Bacteria, UA  RARE  RARE    Urine-Other  MUCOUS PRESENT    POCT PREGNANCY, URINE Status: None    Collection Time    12/17/13 9:28 AM   Result  Value  Range    Preg Test, Ur  NEGATIVE  NEGATIVE   CBC Status: Abnormal    Collection Time    12/17/13 9:33 AM   Result  Value  Range    WBC  9.8  4.0 - 10.5 K/uL    RBC  5.05  3.87 - 5.11 MIL/uL    Hemoglobin  12.9  12.0 - 15.0 g/dL    HCT  16.1  09.6 - 04.5 %    MCV  76.8 (*)  78.0 - 100.0 fL    MCH  25.5 (*)  26.0 - 34.0 pg    MCHC  33.2  30.0 - 36.0 g/dL    RDW  40.9  81.1 - 91.4 %    Platelets  241  150 - 400 K/uL   COMPREHENSIVE METABOLIC PANEL Status: Abnormal    Collection Time    12/17/13 9:33 AM   Result  Value  Range    Sodium  136  135 - 145 mEq/L    Potassium  3.3 (*)  3.5 - 5.1 mEq/L    Chloride  101  96 - 112 mEq/L    CO2  24  19 - 32 mEq/L    Glucose, Bld  108 (*)  70 - 99 mg/dL    BUN  8  6 - 23 mg/dL    Creatinine, Ser  7.82  0.50 - 1.10 mg/dL    Calcium  8.6  8.4 - 10.5 mg/dL    Total Protein  7.3  6.0 - 8.3 g/dL     Albumin  3.3 (*)  3.5 - 5.2 g/dL    AST  16  0 - 37 U/L    ALT  10  0 - 35 U/L    Alkaline Phosphatase  84  39 - 117 U/L    Total Bilirubin  0.6  0.3 - 1.2 mg/dL    GFR calc non Af Amer  >90  >90 mL/min    GFR calc Af Amer  >90  >90 mL/min   Ct Abdomen Pelvis W Contrast  12/17/2013 CLINICAL DATA: Early onset right lower quadrant pain down in the pelvis. EXAM: CT ABDOMEN AND PELVIS WITH CONTRAST TECHNIQUE: Multidetector CT imaging of the abdomen and pelvis was performed using the standard protocol following bolus administration of intravenous contrast. CONTRAST: OMNIPAQUE IOHEXOL 300 MG/ML SOLN COMPARISON: None available FINDINGS: Mild subsegmental atelectasis is seen dependently within the lung bases bilaterally. Visualized heart is within normal limits. The liver demonstrates a normal contrast enhanced appearance. Several mildly hyperdense stones are seen within the gallbladder. No CT evidence of acute cholecystitis. No biliary ductal dilatation. The spleen, adrenal glands, and pancreas demonstrate a normal contrast enhanced appearance. The kidneys are equal in size with symmetric enhancement. No nephrolithiasis, hydronephrosis, or focal enhancing renal mass identified. Stomach is within normal limits. There is no evidence of bowel obstruction. The appendix is well visualized in the right lower quadrant and is of normal caliber and appearance without associated inflammatory changes to suggest acute appendicitis. No abnormal wall thickening or inflammatory fat stranding seen about the bowels. A complex mass is seen within the right adnexal arising from the right  ovary. This lesion measures approximately 11.8 x 4.8 x 5.5 cm (series 2, image 68). There is internal fat and calcific density within this lesion. Finding is most suggestive of a dermoid. No free fluid or inflammatory fat stranding seen within the right adnexa to suggest associated right ovarian torsion. An additional predominantly hypodense  unilocular mass measuring 7.0 x 10.8 x 13.2 cm is seen arising from the left ovary (series 2, image 56). A 7 mm calcific density is seen internally within this lesion (image 64). Focal hypodensity adjacent to this calcification may represent a small amount of fat versus artifact. 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 The bladder is displaced inferiorly within the lower pelvis but is otherwise unremarkable. Uterus is within normal limits. No free air or fluid identified. No pathologically enlarged intra-abdominal pelvic lymph nodes. Osseous structures are within normal limits. No focal lytic or blastic osseous lesions. Sacralization of the left aspect of L5 is noted. The left ovary is within normal limits. IMPRESSION: 1. Complex right adnexal mass with internal fat and calcific density, most consistent with a dermoid. No CT evidence of associated ovarian torsion. 2. Hypodense unilocular left ovarian mass with internal calcification as above. 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 No CT evidence of associated ovarian torsion. These findings could be further evaluated with pelvic MRI as clinically indicated. 3. No CT evidence of acute intra-abdominal or pelvic process. Normal appendix. 4. Cholelithiasis without evidence of acute cholecystitis. Electronically Signed By: Rise Mu M.D. On: 12/17/2013 13:13  CLINICAL DATA: Pelvic pain. Bilateral cystic adnexal masses seen on  recent CT.  EXAM:  TRANSABDOMINAL AND TRANSVAGINAL ULTRASOUND OF PELVIS  DOPPLER ULTRASOUND OF OVARIES  TECHNIQUE:  Both transabdominal and transvaginal ultrasound examinations of the  pelvis were performed. Transabdominal technique was performed for  global imaging of the pelvis including uterus, ovaries, adnexal  regions, and pelvic cul-de-sac.  It was necessary to proceed with endovaginal exam following the  transabdominal exam to visualize the endometrium and ovaries. Color   and duplex Doppler ultrasound was utilized to evaluate blood flow to  the ovaries.  COMPARISON: None.  FINDINGS:  Uterus  Measurements: 9.0 x 4.6 x 4.7 cm. No fibroids or other mass  visualized.  Endometrium  Thickness: 4 mm. No focal abnormality visualized.  Right ovary  Measurements: No normal right ovarian tissue visualized. A cystic  and solid mass is seen measuring 11.1 x 5.5 x 5.9 cm. This contains  a hyperechoic nodule consistent with fat as seen on CT common is  consistent with a right ovarian dermoid.  Left ovary  Measurements: 2.4 x 2.4 x 2.2 cm, which appears separate from a  larger cystic lesion. A cystic lesion is seen containing several  thin internal septations, several of which do not completely across  the lumen. This lesion measures 13.1 x 7.2 x 10.3 cm and has a  somewhat tubular configuration. This could represent a hydrosalpinx  although a cystic ovarian mass cannot definitely be excluded.  Pulsed Doppler evaluation of the left ovary demonstrates normal  low-resistance arterial and venous waveforms. Doppler evaluation of  the right ovary could not be performed is no normal right ovarian  tissue was visualized.  Other findings  No free fluid.  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 Doppler evaluation of the right ovary  could not be performed, as no normal right ovarian tissue  visualized.  13.1 cm complex left adnexal cyst, which appears separate from the  ovary  and is suspicious for a hydrosalpinx. A cystic ovarian mass is  considered less likely. Surgical evaluation should be considered, or  pelvic MRI could be performed for for further characterization.  No sonographic evidence for left ovarian torsion.  Electronically Signed  By: Myles Rosenthal M.D.  On: 12/17/2013 15:51   A/P:  Bilateral adnexal masses, acute pain--symptoms/exam worrisome for focal peritoneal signs DDX--torsion, bleeding into tumor, infarction, leaking  Recommend exploratory  laparotomy with possible ovarian cystectomy/oophorectomy, salpingectomy.  The risks/benefits of surgery were reviewed/informed consent was obtained

## 2013-12-17 NOTE — Transfer of Care (Signed)
Immediate Anesthesia Transfer of Care Note  Patient: Meredith Long  Procedure(s) Performed: Procedure(s): EXPLORATORY LAPAROTOMY (N/A)  Patient Location: PACU  Anesthesia Type:General  Level of Consciousness: awake, alert , oriented and patient cooperative  Airway & Oxygen Therapy: Patient Spontanous Breathing and Patient connected to nasal cannula oxygen  Post-op Assessment: Report given to PACU RN, Post -op Vital signs reviewed and stable and Patient moving all extremities X 4  Post vital signs: Reviewed and stable  Complications: No apparent anesthesia complications

## 2013-12-17 NOTE — Progress Notes (Signed)
Dr. Tamela Oddi discussing results and need for OR. Questions answered. Consent discussed. Patient calling her family.

## 2013-12-18 LAB — BASIC METABOLIC PANEL
BUN: 7 mg/dL (ref 6–23)
Chloride: 99 mEq/L (ref 96–112)
GFR calc non Af Amer: >90 mL/min (ref >90)
Glucose, Bld: 146 mg/dL — ABNORMAL HIGH (ref 70–99)
Potassium: 4.2 mEq/L (ref 3.7–5.3)
Sodium: 136 mEq/L — ABNORMAL LOW (ref 137–147)

## 2013-12-18 LAB — CBC
Hemoglobin: 12.5 g/dL (ref 12.0–15.0)
MCH: 25.2 pg — ABNORMAL LOW (ref 26.0–34.0)
MCHC: 32.6 g/dL (ref 30.0–36.0)
MCV: 77.3 fL — ABNORMAL LOW (ref 78.0–100.0)
RBC: 4.97 MIL/uL (ref 3.87–5.11)
WBC: 10.3 10*3/uL (ref 4.0–10.5)

## 2013-12-18 MED ORDER — IBUPROFEN 800 MG PO TABS
800.0000 mg | ORAL_TABLET | Freq: Three times a day (TID) | ORAL | Status: DC
  Administered 2013-12-18 – 2013-12-19 (×2): 800 mg via ORAL
  Filled 2013-12-18 (×2): qty 1

## 2013-12-18 NOTE — Progress Notes (Signed)
UR completed 

## 2013-12-18 NOTE — Anesthesia Postprocedure Evaluation (Signed)
  Anesthesia Post-op Note  Patient: Tiphanie Arias  Procedure(s) Performed: Procedure(s): EXPLORATORY LAPAROTOMY (N/A) SALPINGO OOPHORECTOMY (Right) OVARIAN CYSTECTOMY (Left)  Patient Location: [facility]  Anesthesia Type:General  Level of Consciousness: awake, alert , oriented and patient cooperative  Airway and Oxygen Therapy: Patient Spontanous Breathing and Patient connected to nasal cannula oxygen 2L/min with O2 SaO2 97% EtCO2 42  Post-op Pain: mild with some mild nausea after getting up for the first time  Post-op Assessment: Patient's Cardiovascular Status Stable and Respiratory Function Stable  Post-op Vital Signs: stable  Complications: No apparent anesthesia complications

## 2013-12-19 ENCOUNTER — Encounter (HOSPITAL_COMMUNITY): Payer: Self-pay | Admitting: Obstetrics & Gynecology

## 2013-12-19 MED ORDER — OXYCODONE-ACETAMINOPHEN 5-325 MG PO TABS: 1.0000 | ORAL_TABLET | ORAL | Status: DC | PRN

## 2013-12-19 NOTE — Discharge Summary (Signed)
  Physician Discharge Summary  Patient ID: Meredith Long MRN: [id number] DOB/AGE: 1981/09/27 32 y.o.  Admit date: 12/17/2013 Discharge date: 12/19/2013  Admission Diagnoses: Active Problems:   Pelvic mass in female  Discharge Diagnoses:  Active Problems:   Pelvic mass in female   Discharged Condition: good  Hospital Course: On 12/17/2013, the patient underwent the following: Procedure(s): EXPLORATORY LAPAROTOMY SALPINGO OOPHORECTOMY OVARIAN CYSTECTOMY.   The postoperative course was uneventful.  She was discharged to home on postoperative day 2 tolerating a regular diet.  Consults: None  Significant Diagnostic Studies: radiology: CT scan: bilateral adnexal masses and Ultrasound: bilateral adnexal masses  Treatments: surgery: see above  Discharge Exam: Blood pressure 95/71, pulse 63, temperature 98.1 F (36.7 C), temperature source Oral, resp. rate 18, height 5' 5.5" (1.664 m), weight 122.471 kg (270 lb), SpO2 100.00%. General appearance: alert GI: soft, non-tender; bowel sounds normal; no masses,  no organomegaly Extremities: extremities normal, atraumatic, no cyanosis or edema Incision/Wound:  C/D/I  Disposition: 01-Home or Self Care  Discharge Orders   Future Appointments Provider Department Dept Phone   11/07/2014 10:00 AM Antionette Char, MD [facility] [phone]   Future Orders Complete By Expires   Call MD for:  extreme fatigue  As directed    Call MD for:  persistant dizziness or light-headedness  As directed    Call MD for:  persistant nausea and vomiting  As directed    Call MD for:  redness, tenderness, or signs of infection (pain, swelling, redness, odor or green/yellow discharge around incision site)  As directed    Call MD for:  severe uncontrolled pain  As directed    Call MD for:  temperature >100.4  As directed    Diet - low sodium heart healthy  As directed    Discharge wound care:  As directed    Comments:     Keep clean and  dry   Driving Restrictions  As directed    Comments:     No driving for 1 week   Increase activity slowly  As directed    Lifting restrictions  As directed    Comments:     No lifting > 5 lbs for 6 weeks   May shower / Bathe  As directed    Comments:     No tub baths for 6 weeks   May walk up steps  As directed    Sexual Activity Restrictions  As directed    Comments:     No intercourse for 6 - 8 weeks       Medication List         oxyCODONE-acetaminophen 5-325 MG per tablet  Commonly known as:  PERCOCET/ROXICET  Take 1-2 tablets by mouth every 4 (four) hours as needed for severe pain (moderate to severe pain (when tolerating fluids)).           Follow-up Information   Follow up with Antionette Char A, MD. Schedule an appointment as soon as possible for a visit in 2 weeks.   Specialty:  Obstetrics and Gynecology   Contact information:   [address] [place] [address] [phone]       Signed: Roseanna Rainbow 12/19/2013, 8:56 AM

## 2013-12-19 NOTE — Progress Notes (Signed)
Discharge instructions provided to patient at bedside.  Activity, follow up appointments, medications, incision care, when to call the doctor and community resources discussed.  No questions at this time.  Patient left unit in stable condition with all personal belongings accompanied by staff.  Osvaldo Angst, RN----------

## 2013-12-26 ENCOUNTER — Encounter: Payer: Self-pay | Admitting: Obstetrics & Gynecology

## 2014-01-01 ENCOUNTER — Encounter: Payer: Self-pay | Admitting: *Deleted

## 2014-01-02 ENCOUNTER — Encounter: Payer: Self-pay | Admitting: Obstetrics & Gynecology

## 2014-01-09 ENCOUNTER — Ambulatory Visit (INDEPENDENT_AMBULATORY_CARE_PROVIDER_SITE_OTHER): Payer: 59 | Admitting: Obstetrics & Gynecology

## 2014-01-09 ENCOUNTER — Encounter: Payer: Self-pay | Admitting: Obstetrics & Gynecology

## 2014-01-09 VITALS — BP 117/77 | HR 82 | Temp 98.0°F | Ht 65.0 in | Wt 261.0 lb

## 2014-01-09 DIAGNOSIS — Z09 Encounter for follow-up examination after completed treatment for conditions other than malignant neoplasm: Secondary | ICD-10-CM

## 2014-01-09 NOTE — Progress Notes (Deleted)
Pt in office today following cyst removal, would like to know about pathology findings, concerned about family hx of cancer, denies drainage redness or warmth to incision site, states has tenderness when touched, denies need for analgesics.   @RRDAYSPOSTSURGERY @ @RRSURGERY @  Subjective: Patient reports {sub:3041132}.    Objective: Vital signs in last 24 hours: @VSRANGES @    Intake/Output from previous day:   Intake/Output this shift: @IOTHISSHIFT @  Physical Examination:  {Physical Exam:3041121}   Labs:  @RRCBC @ @RRCHEM @  Assessment:  33 y.o. s/p : {assessment details:3041134}  Pain: Pain {Is/is not:9024} well-controlled on PCA or oral medications.  Heme:{System heme / coag:30811}  ID: {INFECTIOUS DISEASES:3041306}  CV:  {System cardiovascular:30809} {CHL DESC; HYPERTENSION CONTROL:21525}. Current treatment:  {HYPERTENSION [id number].  GI:  Tolerating po: {yes no:315493::"Yes"}  The pt's N/V is controlled with : {meds; nausea:60360}.  Endo: ***{Diagnoses; diabetes:14078}.  CBG: {Findings; diabetes glucometry results:16657}.  Prophylaxis: {plan; dvt prophylaxis:16689}.  Plan: {WTUU:8280034} Consults: @CM @, Social Work @RRHLOS @    Edward Qualia V 01/09/2014, 10:13 AM

## 2014-01-09 NOTE — Progress Notes (Signed)
Subjective:     Marilu Schoon is a 33 y.o. female who presents to the clinic 3 weeks status post bilateral cystectomy/ oopherectomy for ovarian cyst. Patient has returned to work and is doing well.  The following portions of the patient's history were reviewed and updated as appropriate: allergies, current medications, past family history, past medical history, past social history, past surgical history and problem list.  Review of Systems Pertinent items are noted in HPI.    Objective:    BP 117/77  Pulse 82  Temp(Src) 98 F (36.7 C)  Ht 5\' 5"  (1.651 m)  Wt 261 lb (118.389 kg)  BMI 43.43 kg/m2 General:  alert  Abdomen: soft, bowel sounds active, non-tender  Incision:   no drainage, no erythema, no hernia, no seroma, no swelling, no dehiscence, incision well approximated     Assessment:    Doing well postoperatively. Operative findings again reviewed. Pathology report discussed.    Plan:    1. Continue any current medications. 2. Wound care discussed. 3. Activity restrictions: none 4. Anticipated return to work: now. 5. Follow up: 2 weeks for pelvic exam.

## 2014-01-24 ENCOUNTER — Encounter: Payer: Self-pay | Admitting: Obstetrics & Gynecology

## 2014-02-11 ENCOUNTER — Encounter: Payer: 59 | Admitting: Obstetrics & Gynecology

## 2014-04-22 ENCOUNTER — Encounter: Payer: 59 | Admitting: Internal Medicine

## 2014-04-22 DIAGNOSIS — Z0289 Encounter for other administrative examinations: Secondary | ICD-10-CM

## 2014-06-28 ENCOUNTER — Ambulatory Visit (INDEPENDENT_AMBULATORY_CARE_PROVIDER_SITE_OTHER): Payer: 59 | Admitting: Internal Medicine

## 2014-06-28 ENCOUNTER — Other Ambulatory Visit (INDEPENDENT_AMBULATORY_CARE_PROVIDER_SITE_OTHER): Payer: 59

## 2014-06-28 ENCOUNTER — Encounter: Payer: Self-pay | Admitting: Internal Medicine

## 2014-06-28 VITALS — BP 100/70 | HR 90 | Temp 98.1°F | Resp 16 | Ht 65.0 in | Wt 259.0 lb

## 2014-06-28 DIAGNOSIS — Z0000 Encounter for general adult medical examination without abnormal findings: Secondary | ICD-10-CM

## 2014-06-28 DIAGNOSIS — R7309 Other abnormal glucose: Secondary | ICD-10-CM

## 2014-06-28 DIAGNOSIS — R7303 Prediabetes: Secondary | ICD-10-CM

## 2014-06-28 DIAGNOSIS — E669 Obesity, unspecified: Secondary | ICD-10-CM

## 2014-06-28 LAB — CBC WITH DIFFERENTIAL/PLATELET
Basophils Absolute: 0 10*3/uL (ref 0.0–0.1)
Basophils Relative: 0.4 % (ref 0.0–3.0)
EOS ABS: 0.1 10*3/uL (ref 0.0–0.7)
Eosinophils Relative: 0.9 % (ref 0.0–5.0)
HCT: 41 % (ref 36.0–46.0)
HEMOGLOBIN: 13.4 g/dL (ref 12.0–15.0)
Lymphocytes Relative: 24.8 % (ref 12.0–46.0)
Lymphs Abs: 1.6 10*3/uL (ref 0.7–4.0)
MCHC: 32.6 g/dL (ref 30.0–36.0)
MCV: 76.7 fl — AB (ref 78.0–100.0)
MONO ABS: 0.3 10*3/uL (ref 0.1–1.0)
Monocytes Relative: 5 % (ref 3.0–12.0)
NEUTROS ABS: 4.4 10*3/uL (ref 1.4–7.7)
Neutrophils Relative %: 68.9 % (ref 43.0–77.0)
Platelets: 262 10*3/uL (ref 150.0–400.0)
RBC: 5.34 Mil/uL — ABNORMAL HIGH (ref 3.87–5.11)
RDW: 15.9 % — ABNORMAL HIGH (ref 11.5–15.5)
WBC: 6.4 10*3/uL (ref 4.0–10.5)

## 2014-06-28 LAB — COMPREHENSIVE METABOLIC PANEL
ALBUMIN: 3.4 g/dL — AB (ref 3.5–5.2)
ALK PHOS: 69 U/L (ref 39–117)
ALT: 13 U/L (ref 0–35)
AST: 18 U/L (ref 0–37)
BUN: 10 mg/dL (ref 6–23)
CO2: 24 mEq/L (ref 19–32)
CREATININE: 0.7 mg/dL (ref 0.4–1.2)
Calcium: 8.9 mg/dL (ref 8.4–10.5)
Chloride: 105 mEq/L (ref 96–112)
GFR: 122.99 mL/min (ref >60.00)
Glucose, Bld: 86 mg/dL (ref 70–99)
POTASSIUM: 3.7 meq/L (ref 3.5–5.1)
Sodium: 136 mEq/L (ref 135–145)
Total Bilirubin: 0.9 mg/dL (ref 0.2–1.2)
Total Protein: 7.4 g/dL (ref 6.0–8.3)

## 2014-06-28 LAB — URINALYSIS, ROUTINE W REFLEX MICROSCOPIC
BILIRUBIN URINE: NEGATIVE
KETONES UR: NEGATIVE
Nitrite: NEGATIVE
Specific Gravity, Urine: 1.015 (ref 1.000–1.030)
TOTAL PROTEIN, URINE-UPE24: NEGATIVE
URINE GLUCOSE: NEGATIVE
Urobilinogen, UA: 0.2 (ref 0.0–1.0)
pH: 6 (ref 5.0–8.0)

## 2014-06-28 LAB — HEMOGLOBIN A1C: HEMOGLOBIN A1C: 5.8 % (ref 4.6–6.5)

## 2014-06-28 LAB — LIPID PANEL
Cholesterol: 116 mg/dL (ref 0–200)
HDL: 39.7 mg/dL (ref >39.00)
LDL Cholesterol: 71 mg/dL (ref 0–99)
NONHDL: 76.3
Total CHOL/HDL Ratio: 3
Triglycerides: 29 mg/dL (ref 0.0–149.0)
VLDL: 5.8 mg/dL (ref 0.0–40.0)

## 2014-06-28 LAB — TSH: TSH: 1.24 u[IU]/mL (ref 0.35–4.50)

## 2014-06-28 LAB — HCG, QUANTITATIVE, PREGNANCY: Quantitative HCG: 0.24 m[IU]/mL

## 2014-06-28 MED ORDER — PHENTERMINE-TOPIRAMATE ER 7.5-46 MG PO CP24: 1.0000 | ORAL_CAPSULE | Freq: Every morning | ORAL | Status: DC

## 2014-06-28 MED ORDER — PHENTERMINE-TOPIRAMATE ER 3.75-23 MG PO CP24: 1.0000 | ORAL_CAPSULE | Freq: Every morning | ORAL | Status: DC

## 2014-06-28 NOTE — Patient Instructions (Addendum)
Preventive Care for Adults A healthy lifestyle and preventive care can promote health and wellness. Preventive health guidelines for women include the following key practices.  A routine yearly physical is a good way to check with your health care provider about your health and preventive screening. It is a chance to share any concerns and updates on your health and to receive a thorough exam.  Visit your dentist for a routine exam and preventive care every 6 months. Brush your teeth twice a day and floss once a day. Good oral hygiene prevents tooth decay and gum disease.  The frequency of eye exams is based on your age, health, family medical history, use of contact lenses, and other factors. Follow your health care provider's recommendations for frequency of eye exams.  Eat a healthy diet. Foods like vegetables, fruits, whole grains, low-fat dairy products, and lean protein foods contain the nutrients you need without too many calories. Decrease your intake of foods high in solid fats, added sugars, and salt. Eat the right amount of calories for you.Get information about a proper diet from your health care provider, if necessary.  Regular physical exercise is one of the most important things you can do for your health. Most adults should get at least 150 minutes of moderate-intensity exercise (any activity that increases your heart rate and causes you to sweat) each week. In addition, most adults need muscle-strengthening exercises on 2 or more days a week.  Maintain a healthy weight. The body mass index (BMI) is a screening tool to identify possible weight problems. It provides an estimate of body fat based on height and weight. Your health care provider can find your BMI, and can help you achieve or maintain a healthy weight.For adults 20 years and older:  A BMI below 18.5 is considered underweight.  A BMI of 18.5 to 24.9 is normal.  A BMI of 25 to 29.9 is considered overweight.  A BMI of  30 and above is considered obese.  Maintain normal blood lipids and cholesterol levels by exercising and minimizing your intake of saturated fat. Eat a balanced diet with plenty of fruit and vegetables. Blood tests for lipids and cholesterol should begin at age 52 and be repeated every 5 years. If your lipid or cholesterol levels are high, you are over 50, or you are at high risk for heart disease, you may need your cholesterol levels checked more frequently.Ongoing high lipid and cholesterol levels should be treated with medicines if diet and exercise are not working.  If you smoke, find out from your health care provider how to quit. If you do not use tobacco, do not start.  Lung cancer screening is recommended for adults aged 37-80 years who are at high risk for developing lung cancer because of a history of smoking. A yearly low-dose CT scan of the lungs is recommended for people who have at least a 30-pack-year history of smoking and are a current smoker or have quit within the past 15 years. A pack year of smoking is smoking an average of 1 pack of cigarettes a day for 1 year (for example: 1 pack a day for 30 years or 2 packs a day for 15 years). Yearly screening should continue until the smoker has stopped smoking for at least 15 years. Yearly screening should be stopped for people who develop a health problem that would prevent them from having lung cancer treatment.  If you are pregnant, do not drink alcohol. If you are breastfeeding,  be very cautious about drinking alcohol. If you are not pregnant and choose to drink alcohol, do not have more than 1 drink per day. One drink is considered to be 12 ounces (355 mL) of beer, 5 ounces (148 mL) of wine, or 1.5 ounces (44 mL) of liquor.  Avoid use of street drugs. Do not share needles with anyone. Ask for help if you need support or instructions about stopping the use of drugs.  High blood pressure causes heart disease and increases the risk of  stroke. Your blood pressure should be checked at least every 1 to 2 years. Ongoing high blood pressure should be treated with medicines if weight loss and exercise do not work.  If you are 75-52 years old, ask your health care provider if you should take aspirin to prevent strokes.  Diabetes screening involves taking a blood sample to check your fasting blood sugar level. This should be done once every 3 years, after age 15, if you are within normal weight and without risk factors for diabetes. Testing should be considered at a younger age or be carried out more frequently if you are overweight and have at least 1 risk factor for diabetes.  Breast cancer screening is essential preventive care for women. You should practice "breast self-awareness." This means understanding the normal appearance and feel of your breasts and may include breast self-examination. Any changes detected, no matter how small, should be reported to a health care provider. Women in their 58s and 30s should have a clinical breast exam (CBE) by a health care provider as part of a regular health exam every 1 to 3 years. After age 16, women should have a CBE every year. Starting at age 53, women should consider having a mammogram (breast X-ray test) every year. Women who have a family history of breast cancer should talk to their health care provider about genetic screening. Women at a high risk of breast cancer should talk to their health care providers about having an MRI and a mammogram every year.  Breast cancer gene (BRCA)-related cancer risk assessment is recommended for women who have family members with BRCA-related cancers. BRCA-related cancers include breast, ovarian, tubal, and peritoneal cancers. Having family members with these cancers may be associated with an increased risk for harmful changes (mutations) in the breast cancer genes BRCA1 and BRCA2. Results of the assessment will determine the need for genetic counseling and  BRCA1 and BRCA2 testing.  Routine pelvic exams to screen for cancer are no longer recommended for nonpregnant women who are considered low risk for cancer of the pelvic organs (ovaries, uterus, and vagina) and who do not have symptoms. Ask your health care provider if a screening pelvic exam is right for you.  If you have had past treatment for cervical cancer or a condition that could lead to cancer, you need Pap tests and screening for cancer for at least 20 years after your treatment. If Pap tests have been discontinued, your risk factors (such as having a new sexual partner) need to be reassessed to determine if screening should be resumed. Some women have medical problems that increase the chance of getting cervical cancer. In these cases, your health care provider may recommend more frequent screening and Pap tests.  The HPV test is an additional test that may be used for cervical cancer screening. The HPV test looks for the virus that can cause the cell changes on the cervix. The cells collected during the Pap test can be  tested for HPV. The HPV test could be used to screen women aged 47 years and older, and should be used in women of any age who have unclear Pap test results. After the age of 36, women should have HPV testing at the same frequency as a Pap test.  Colorectal cancer can be detected and often prevented. Most routine colorectal cancer screening begins at the age of 38 years and continues through age 58 years. However, your health care provider may recommend screening at an earlier age if you have risk factors for colon cancer. On a yearly basis, your health care provider may provide home test kits to check for hidden blood in the stool. Use of a small camera at the end of a tube, to directly examine the colon (sigmoidoscopy or colonoscopy), can detect the earliest forms of colorectal cancer. Talk to your health care provider about this at age 64, when routine screening begins. Direct  exam of the colon should be repeated every 5-10 years through age 21 years, unless early forms of pre-cancerous polyps or small growths are found.  People who are at an increased risk for hepatitis B should be screened for this virus. You are considered at high risk for hepatitis B if:  You were born in a country where hepatitis B occurs often. Talk with your health care provider about which countries are considered high risk.  Your parents were born in a high-risk country and you have not received a shot to protect against hepatitis B (hepatitis B vaccine).  You have HIV or AIDS.  You use needles to inject street drugs.  You live with, or have sex with, someone who has Hepatitis B.  You get hemodialysis treatment.  You take certain medicines for conditions like cancer, organ transplantation, and autoimmune conditions.  Hepatitis C blood testing is recommended for all people born from [birth year] through 1965 and any individual with known risks for hepatitis C.  Practice safe sex. Use condoms and avoid high-risk sexual practices to reduce the spread of sexually transmitted infections (STIs). STIs include gonorrhea, chlamydia, syphilis, trichomonas, herpes, HPV, and human immunodeficiency virus (HIV). Herpes, HIV, and HPV are viral illnesses that have no cure. They can result in disability, cancer, and death.  You should be screened for sexually transmitted illnesses (STIs) including gonorrhea and chlamydia if:  You are sexually active and are younger than 24 years.  You are older than 24 years and your health care provider tells you that you are at risk for this type of infection.  Your sexual activity has changed since you were last screened and you are at an increased risk for chlamydia or gonorrhea. Ask your health care provider if you are at risk.  If you are at risk of being infected with HIV, it is recommended that you take a prescription medicine daily to prevent HIV infection. This is  called preexposure prophylaxis (PrEP). You are considered at risk if:  You are a heterosexual woman, are sexually active, and are at increased risk for HIV infection.  You take drugs by injection.  You are sexually active with a partner who has HIV.  Talk with your health care provider about whether you are at high risk of being infected with HIV. If you choose to begin PrEP, you should first be tested for HIV. You should then be tested every 3 months for as long as you are taking PrEP.  Osteoporosis is a disease in which the bones lose minerals and strength  with aging. This can result in serious bone fractures or breaks. The risk of osteoporosis can be identified using a bone density scan. Women ages 65 years and over and women at risk for fractures or osteoporosis should discuss screening with their health care providers. Ask your health care provider whether you should take a calcium supplement or vitamin D to reduce the rate of osteoporosis.  Menopause can be associated with physical symptoms and risks. Hormone replacement therapy is available to decrease symptoms and risks. You should talk to your health care provider about whether hormone replacement therapy is right for you.  Use sunscreen. Apply sunscreen liberally and repeatedly throughout the day. You should seek shade when your shadow is shorter than you. Protect yourself by wearing long sleeves, pants, a wide-brimmed hat, and sunglasses year round, whenever you are outdoors.  Once a month, do a whole body skin exam, using a mirror to look at the skin on your back. Tell your health care provider of new moles, moles that have irregular borders, moles that are larger than a pencil eraser, or moles that have changed in shape or color.  Stay current with required vaccines (immunizations).  Influenza vaccine. All adults should be immunized every year.  Tetanus, diphtheria, and acellular pertussis (Td, Tdap) vaccine. Pregnant women should  receive 1 dose of Tdap vaccine during each pregnancy. The dose should be obtained regardless of the length of time since the last dose. Immunization is preferred during the 27th-36th week of gestation. An adult who has not previously received Tdap or who does not know her vaccine status should receive 1 dose of Tdap. This initial dose should be followed by tetanus and diphtheria toxoids (Td) booster doses every 10 years. Adults with an unknown or incomplete history of completing a 3-dose immunization series with Td-containing vaccines should begin or complete a primary immunization series including a Tdap dose. Adults should receive a Td booster every 10 years.  Varicella vaccine. An adult without evidence of immunity to varicella should receive 2 doses or a second dose if she has previously received 1 dose. Pregnant females who do not have evidence of immunity should receive the first dose after pregnancy. This first dose should be obtained before leaving the health care facility. The second dose should be obtained 4-8 weeks after the first dose.  Human papillomavirus (HPV) vaccine. Females aged 13-26 years who have not received the vaccine previously should obtain the 3-dose series. The vaccine is not recommended for use in pregnant females. However, pregnancy testing is not needed before receiving a dose. If a female is found to be pregnant after receiving a dose, no treatment is needed. In that case, the remaining doses should be delayed until after the pregnancy. Immunization is recommended for any person with an immunocompromised condition through the age of 26 years if she did not get any or all doses earlier. During the 3-dose series, the second dose should be obtained 4-8 weeks after the first dose. The third dose should be obtained 24 weeks after the first dose and 16 weeks after the second dose.  Zoster vaccine. One dose is recommended for adults aged 60 years or older unless certain conditions are  present.  Measles, mumps, and rubella (MMR) vaccine. Adults born before 1957 generally are considered immune to measles and mumps. Adults born in 1957 or later should have 1 or more doses of MMR vaccine unless there is a contraindication to the vaccine or there is laboratory evidence of immunity to   each of the three diseases. A routine second dose of MMR vaccine should be obtained at least 28 days after the first dose for students attending postsecondary schools, health care workers, or international travelers. People who received inactivated measles vaccine or an unknown type of measles vaccine during 1963-1967 should receive 2 doses of MMR vaccine. People who received inactivated mumps vaccine or an unknown type of mumps vaccine before 1979 and are at high risk for mumps infection should consider immunization with 2 doses of MMR vaccine. For females of childbearing age, rubella immunity should be determined. If there is no evidence of immunity, females who are not pregnant should be vaccinated. If there is no evidence of immunity, females who are pregnant should delay immunization until after pregnancy. Unvaccinated health care workers born before 1957 who lack laboratory evidence of measles, mumps, or rubella immunity or laboratory confirmation of disease should consider measles and mumps immunization with 2 doses of MMR vaccine or rubella immunization with 1 dose of MMR vaccine.  Pneumococcal 13-valent conjugate (PCV13) vaccine. When indicated, a person who is uncertain of her immunization history and has no record of immunization should receive the PCV13 vaccine. An adult aged 19 years or older who has certain medical conditions and has not been previously immunized should receive 1 dose of PCV13 vaccine. This PCV13 should be followed with a dose of pneumococcal polysaccharide (PPSV23) vaccine. The PPSV23 vaccine dose should be obtained at least 8 weeks after the dose of PCV13 vaccine. An adult aged 19  years or older who has certain medical conditions and previously received 1 or more doses of PPSV23 vaccine should receive 1 dose of PCV13. The PCV13 vaccine dose should be obtained 1 or more years after the last PPSV23 vaccine dose.  Pneumococcal polysaccharide (PPSV23) vaccine. When PCV13 is also indicated, PCV13 should be obtained first. All adults aged 65 years and older should be immunized. An adult younger than age 65 years who has certain medical conditions should be immunized. Any person who resides in a nursing home or long-term care facility should be immunized. An adult smoker should be immunized. People with an immunocompromised condition and certain other conditions should receive both PCV13 and PPSV23 vaccines. People with human immunodeficiency virus (HIV) infection should be immunized as soon as possible after diagnosis. Immunization during chemotherapy or radiation therapy should be avoided. Routine use of PPSV23 vaccine is not recommended for American Indians, [place] Natives, or people younger than 65 years unless there are medical conditions that require PPSV23 vaccine. When indicated, people who have unknown immunization and have no record of immunization should receive PPSV23 vaccine. One-time revaccination 5 years after the first dose of PPSV23 is recommended for people aged 19-64 years who have chronic kidney failure, nephrotic syndrome, asplenia, or immunocompromised conditions. People who received 1-2 doses of PPSV23 before age 65 years should receive another dose of PPSV23 vaccine at age 65 years or later if at least 5 years have passed since the previous dose. Doses of PPSV23 are not needed for people immunized with PPSV23 at or after age 65 years.  Meningococcal vaccine. Adults with asplenia or persistent complement component deficiencies should receive 2 doses of quadrivalent meningococcal conjugate (MenACWY-D) vaccine. The doses should be obtained at least 2 months apart.  Microbiologists working with certain meningococcal bacteria, military recruits, people at risk during an outbreak, and people who travel to or live in countries with a high rate of meningitis should be immunized. A first-year college student up through age   21 years who is living in a residence hall should receive a dose if she did not receive a dose on or after her 16th birthday. Adults who have certain high-risk conditions should receive one or more doses of vaccine.  Hepatitis A vaccine. Adults who wish to be protected from this disease, have certain high-risk conditions, work with hepatitis A-infected animals, work in hepatitis A research labs, or travel to or work in countries with a high rate of hepatitis A should be immunized. Adults who were previously unvaccinated and who anticipate close contact with an international adoptee during the first 60 days after arrival in the [place] from a country with a high rate of hepatitis A should be immunized.  Hepatitis B vaccine. Adults who wish to be protected from this disease, have certain high-risk conditions, may be exposed to blood or other infectious body fluids, are household contacts or sex partners of hepatitis B positive people, are clients or workers in certain care facilities, or travel to or work in countries with a high rate of hepatitis B should be immunized.  Haemophilus influenzae type b (Hib) vaccine. A previously unvaccinated person with asplenia or sickle cell disease or having a scheduled splenectomy should receive 1 dose of Hib vaccine. Regardless of previous immunization, a recipient of a hematopoietic stem cell transplant should receive a 3-dose series 6-12 months after her successful transplant. Hib vaccine is not recommended for adults with HIV infection. Preventive Services / Frequency Ages 43 to 39years  Blood pressure check.** / Every 1 to 2 years.  Lipid and cholesterol check.** / Every 5 years beginning at age  75.  Clinical breast exam.** / Every 3 years for women in their 32s and 74s.  BRCA-related cancer risk assessment.** / For women who have family members with a BRCA-related cancer (breast, ovarian, tubal, or peritoneal cancers).  Pap test.** / Every 2 years from ages 65 through 91. Every 3 years starting at age 34 through age 93 or 72 with a history of 3 consecutive normal Pap tests.  HPV screening.** / Every 3 years from ages 46 through ages 53 to 26 with a history of 3 consecutive normal Pap tests.  Hepatitis C blood test.** / For any individual with known risks for hepatitis C.  Skin self-exam. / Monthly.  Influenza vaccine. / Every year.  Tetanus, diphtheria, and acellular pertussis (Tdap, Td) vaccine.** / Consult your health care provider. Pregnant women should receive 1 dose of Tdap vaccine during each pregnancy. 1 dose of Td every 10 years.  Varicella vaccine.** / Consult your health care provider. Pregnant females who do not have evidence of immunity should receive the first dose after pregnancy.  HPV vaccine. / 3 doses over 6 months, if 70 and younger. The vaccine is not recommended for use in pregnant females. However, pregnancy testing is not needed before receiving a dose.  Measles, mumps, rubella (MMR) vaccine.** / You need at least 1 dose of MMR if you were born in 1957 or later. You may also need a 2nd dose. For females of childbearing age, rubella immunity should be determined. If there is no evidence of immunity, females who are not pregnant should be vaccinated. If there is no evidence of immunity, females who are pregnant should delay immunization until after pregnancy.  Pneumococcal 13-valent conjugate (PCV13) vaccine.** / Consult your health care provider.  Pneumococcal polysaccharide (PPSV23) vaccine.** / 1 to 2 doses if you smoke cigarettes or if you have certain conditions.  Meningococcal vaccine.** /  1 dose if you are age 70 to 51 years and a [place] living in a residence hall, or have one of several medical conditions, you need to get vaccinated against meningococcal disease. You may also need additional booster doses.  Hepatitis A vaccine.** / Consult your health care provider.  Hepatitis B vaccine.** / Consult your health care provider.  Haemophilus influenzae type b (Hib) vaccine.** / Consult your health care provider. Ages 40 to 64years  Blood pressure check.** / Every 1 to 2 years.  Lipid and cholesterol check.** / Every 5 years beginning at age 58 years.  Lung cancer screening. / Every year if you are aged 56-80 years and have a 30-pack-year history of smoking and currently smoke or have quit within the past 15 years. Yearly screening is stopped once you have quit smoking for at least 15 years or develop a health problem that would prevent you from having lung cancer treatment.  Clinical breast exam.** / Every year after age 35 years.  BRCA-related cancer risk assessment.** / For women who have family members with a BRCA-related cancer (breast, ovarian, tubal, or peritoneal cancers).  Mammogram.** / Every year beginning at age 109 years and continuing for as long as you are in good health. Consult with your health care provider.  Pap test.** / Every 3 years starting at age 44 years through age 94 or 70 years with a history of 3 consecutive normal Pap tests.  HPV screening.** / Every 3 years from ages 109 years through ages 50 to 30 years with a history of 3 consecutive normal Pap tests.  Fecal occult blood test (FOBT) of stool. / Every year beginning at age 73 years and continuing until age 59 years. You may not need to do this test if you get a colonoscopy every 10 years.  Flexible sigmoidoscopy or colonoscopy.** / Every 5 years for a flexible sigmoidoscopy or every 10 years for a colonoscopy beginning at age 68 years and continuing until age 12 years.  Hepatitis C blood test.** / For all people born from [birth year] through  1965 and any individual with known risks for hepatitis C.  Skin self-exam. / Monthly.  Influenza vaccine. / Every year.  Tetanus, diphtheria, and acellular pertussis (Tdap/Td) vaccine.** / Consult your health care provider. Pregnant women should receive 1 dose of Tdap vaccine during each pregnancy. 1 dose of Td every 10 years.  Varicella vaccine.** / Consult your health care provider. Pregnant females who do not have evidence of immunity should receive the first dose after pregnancy.  Zoster vaccine.** / 1 dose for adults aged 2 years or older.  Measles, mumps, rubella (MMR) vaccine.** / You need at least 1 dose of MMR if you were born in 1957 or later. You may also need a 2nd dose. For females of childbearing age, rubella immunity should be determined. If there is no evidence of immunity, females who are not pregnant should be vaccinated. If there is no evidence of immunity, females who are pregnant should delay immunization until after pregnancy.  Pneumococcal 13-valent conjugate (PCV13) vaccine.** / Consult your health care provider.  Pneumococcal polysaccharide (PPSV23) vaccine.** / 1 to 2 doses if you smoke cigarettes or if you have certain conditions.  Meningococcal vaccine.** / Consult your health care provider.  Hepatitis A vaccine.** / Consult your health care provider.  Hepatitis B vaccine.** / Consult your health care provider.  Haemophilus influenzae type b (Hib) vaccine.** / Consult your health care provider. Ages 48 years  and over  Blood pressure check.** / Every 1 to 2 years.  Lipid and cholesterol check.** / Every 5 years beginning at age 20 years.  Lung cancer screening. / Every year if you are aged 23-80 years and have a 30-pack-year history of smoking and currently smoke or have quit within the past 15 years. Yearly screening is stopped once you have quit smoking for at least 15 years or develop a health problem that would prevent you from having lung cancer  treatment.  Clinical breast exam.** / Every year after age 55 years.  BRCA-related cancer risk assessment.** / For women who have family members with a BRCA-related cancer (breast, ovarian, tubal, or peritoneal cancers).  Mammogram.** / Every year beginning at age 10 years and continuing for as long as you are in good health. Consult with your health care provider.  Pap test.** / Every 3 years starting at age 55 years through age 18 or 57 years with 3 consecutive normal Pap tests. Testing can be stopped between 65 and 70 years with 3 consecutive normal Pap tests and no abnormal Pap or HPV tests in the past 10 years.  HPV screening.** / Every 3 years from ages 87 years through ages 64 or 78 years with a history of 3 consecutive normal Pap tests. Testing can be stopped between 65 and 70 years with 3 consecutive normal Pap tests and no abnormal Pap or HPV tests in the past 10 years.  Fecal occult blood test (FOBT) of stool. / Every year beginning at age 23 years and continuing until age 44 years. You may not need to do this test if you get a colonoscopy every 10 years.  Flexible sigmoidoscopy or colonoscopy.** / Every 5 years for a flexible sigmoidoscopy or every 10 years for a colonoscopy beginning at age 3 years and continuing until age 35 years.  Hepatitis C blood test.** / For all people born from [birth year] through 1964 and any individual with known risks for hepatitis C.  Osteoporosis screening.** / A one-time screening for women ages 25 years and over and women at risk for fractures or osteoporosis.  Skin self-exam. / Monthly.  Influenza vaccine. / Every year.  Tetanus, diphtheria, and acellular pertussis (Tdap/Td) vaccine.** / 1 dose of Td every 10 years.  Varicella vaccine.** / Consult your health care provider.  Zoster vaccine.** / 1 dose for adults aged 34 years or older.  Pneumococcal 13-valent conjugate (PCV13) vaccine.** / Consult your health care provider.  Pneumococcal  polysaccharide (PPSV23) vaccine.** / 1 dose for all adults aged 44 years and older.  Meningococcal vaccine.** / Consult your health care provider.  Hepatitis A vaccine.** / Consult your health care provider.  Hepatitis B vaccine.** / Consult your health care provider.  Haemophilus influenzae type b (Hib) vaccine.** / Consult your health care provider. ** Family history and personal history of risk and conditions may change your health care provider's recommendations. Document Released: 02/01/2002 Document Revised: 12/11/2013 Document Reviewed: 05/03/2011 [facility] Patient Information 2015 [place], [place]. This information is not intended to replace advice given to you by your health care provider. Make sure you discuss any questions you have with your health care provider. Obesity Obesity is defined as having too much total body fat and a body mass index (BMI) of 30 or more. BMI is an estimate of body fat and is calculated from your height and weight. Obesity happens when you consume more calories than you can burn by exercising or performing daily physical tasks. Prolonged  obesity can cause major illnesses or emergencies, such as:   A stroke.  Heart disease.  Diabetes.  Cancer.  Arthritis.  High blood pressure (hypertension).  High cholesterol.  Sleep apnea.  Erectile dysfunction.  Infertility problems. CAUSES   Regularly eating unhealthy foods.  Physical inactivity.  Certain disorders, such as an underactive thyroid (hypothyroidism), Cushing's syndrome, and polycystic ovarian syndrome.  Certain medicines, such as steroids, some depression medicines, and antipsychotics.  Genetics.  Lack of sleep. DIAGNOSIS  A caregiver can diagnose obesity after calculating your BMI. Obesity will be diagnosed if your BMI is 30 or higher.  There are other methods of measuring obesity levels. Some other methods include measuring your skin fold thickness, your waist circumference, and  comparing your hip circumference to your waist circumference. TREATMENT  A healthy treatment program includes some or all of the following:  Long-term dietary changes.  Exercise and physical activity.  Behavioral and lifestyle changes.  Medicine only under the supervision of your caregiver. Medicines may help, but only if they are used with diet and exercise programs. An unhealthy treatment program includes:  Fasting.  Fad diets.  Supplements and drugs. These choices do not succeed in long-term weight control.  HOME CARE INSTRUCTIONS   Exercise and perform physical activity as directed by your caregiver. To increase physical activity, try the following:  Use stairs instead of elevators.  Park farther away from store entrances.  Garden, bike, or walk instead of watching television or using the computer.  Eat healthy, low-calorie foods and drinks on a regular basis. Eat more fruits and vegetables. Use low-calorie cookbooks or take healthy cooking classes.  Limit fast food, sweets, and processed snack foods.  Eat smaller portions.  Keep a daily journal of everything you eat. There are many free websites to help you with this. It may be helpful to measure your foods so you can determine if you are eating the correct portion sizes.  Avoid drinking alcohol. Drink more water and drinks without calories.  Take vitamins and supplements only as recommended by your caregiver.  Weight-loss support groups, Nurse, mental health, counselors, and stress reduction education can also be very helpful. SEEK IMMEDIATE MEDICAL CARE IF:  You have chest pain or tightness.  You have trouble breathing or feel short of breath.  You have weakness or leg numbness.  You feel confused or have trouble talking.  You have sudden changes in your vision. MAKE SURE YOU:  Understand these instructions.  Will watch your condition.  Will get help right away if you are not doing well or get  worse. Document Released: 01/13/2005 Document Revised: 06/06/2012 Document Reviewed: 01/12/2012 [facility] Patient Information 2015 [place], [place]. This information is not intended to replace advice given to you by your health care provider. Make sure you discuss any questions you have with your health care provider.

## 2014-06-28 NOTE — Progress Notes (Signed)
Pre visit review using our clinic review tool, if applicable. No additional management support is needed unless otherwise documented below in the visit note. 

## 2014-06-28 NOTE — Progress Notes (Signed)
   Subjective:    Patient ID: Meredith Long, female    DOB: [birth year], 33 y.o.   MRN: [id number]  HPI Comments: She returns for a physical but she also complains that she has not lost weight with diet and exercise and she wants to try a medication for this.     Review of Systems  Constitutional: Negative.  Negative for fever, chills, diaphoresis, activity change, appetite change, fatigue and unexpected weight change.  HENT: Negative.   Eyes: Negative.   Respiratory: Negative.  Negative for apnea, cough, choking, chest tightness, shortness of breath, wheezing and stridor.   Cardiovascular: Negative.  Negative for chest pain, palpitations and leg swelling.  Gastrointestinal: Negative.  Negative for nausea, vomiting, abdominal pain, diarrhea, constipation and blood in stool.  Endocrine: Negative.   Genitourinary: Negative.   Musculoskeletal: Negative.  Negative for arthralgias, back pain, gait problem, joint swelling, myalgias, neck pain and neck stiffness.  Skin: Negative.  Negative for rash.  Allergic/Immunologic: Negative.   Neurological: Negative.   Hematological: Negative.  Negative for adenopathy. Does not bruise/bleed easily.  Psychiatric/Behavioral: Negative.        Objective:   Physical Exam  Vitals reviewed. Constitutional: She is oriented to person, place, and time. She appears well-developed and well-nourished. No distress.  HENT:  Head: Normocephalic and atraumatic.  Mouth/Throat: Oropharynx is clear and moist. No oropharyngeal exudate.  Eyes: Conjunctivae are normal. Right eye exhibits no discharge. Left eye exhibits no discharge. No scleral icterus.  Neck: Normal range of motion. Neck supple. No JVD present. No tracheal deviation present. No thyromegaly present.  Cardiovascular: Normal rate, regular rhythm, normal heart sounds and intact distal pulses.  Exam reveals no gallop and no friction rub.   No murmur heard. Pulmonary/Chest: Effort normal and breath sounds  normal. No stridor. No respiratory distress. She has no wheezes. She has no rales. She exhibits no tenderness.  Abdominal: Soft. Bowel sounds are normal. She exhibits no distension and no mass. There is no tenderness. There is no rebound and no guarding.  Musculoskeletal: Normal range of motion. She exhibits no edema and no tenderness.  Lymphadenopathy:    She has no cervical adenopathy.  Neurological: She is oriented to person, place, and time.  Skin: Skin is warm and dry. No rash noted. She is not diaphoretic. No erythema. No pallor.  Psychiatric: She has a normal mood and affect. Her behavior is normal. Judgment and thought content normal.     Lab Results  Component Value Date   WBC 10.3 12/18/2013   HGB 12.5 12/18/2013   HCT 38.4 12/18/2013   PLT 234 12/18/2013   GLUCOSE 146* 12/18/2013   CHOL 131 05/22/2012   TRIG 47 05/22/2012   HDL 42 05/22/2012   LDLCALC 80 05/22/2012   ALT 10 12/17/2013   AST 16 12/17/2013   NA 136* 12/18/2013   K 4.2 12/18/2013   CL 99 12/18/2013   CREATININE 0.62 12/18/2013   BUN 7 12/18/2013   CO2 27 12/18/2013   HGBA1C 5.7 05/22/2012       Assessment & Plan:

## 2014-06-30 NOTE — Assessment & Plan Note (Signed)
Her labs are normal - no secondary causes She will see nutrition and will start Qsymia to help with her weight loss program

## 2014-06-30 NOTE — Assessment & Plan Note (Signed)
Exam done Labs reviewed Vaccines were reviewed Pt ed material was given 

## 2014-06-30 NOTE — Assessment & Plan Note (Signed)
She will cont to work on her lifestyle modifications Referral sent to nutrition

## 2014-07-15 ENCOUNTER — Telehealth: Payer: Self-pay | Admitting: Internal Medicine

## 2014-07-15 NOTE — Telephone Encounter (Signed)
She needs to move up to the higher dose of Qsymia

## 2014-07-15 NOTE — Telephone Encounter (Signed)
Pt called stated that Qsymia is not working for her, pt request something just with phentermine (it might work better). Please advise.

## 2014-07-16 NOTE — Telephone Encounter (Signed)
Stay on the same dose for now. The dose will increase with her next fill.

## 2014-07-16 NOTE — Telephone Encounter (Signed)
Per pt, she has been taking high dose since day 3 with no results. She request additional advisement. Thanks

## 2014-07-17 ENCOUNTER — Telehealth: Payer: Self-pay | Admitting: *Deleted

## 2014-07-17 NOTE — Telephone Encounter (Signed)
Left msg on triage stating wanting to know what md advise on dosage fir diet med she is taking. Called pt back inform her what md stated on msg 07/15/14 for her to keep taking same dosage of the Qsymia. Dosage will increase at the next refill...Johny Chess

## 2014-07-18 ENCOUNTER — Ambulatory Visit: Payer: 59 | Admitting: Dietician

## 2014-07-29 ENCOUNTER — Telehealth: Payer: Self-pay | Admitting: Internal Medicine

## 2014-07-29 DIAGNOSIS — R7303 Prediabetes: Secondary | ICD-10-CM

## 2014-07-29 MED ORDER — PHENTERMINE-TOPIRAMATE ER 11.25-69 MG PO CP24: 1.0000 | ORAL_CAPSULE | Freq: Every day | ORAL | Status: DC

## 2014-07-29 NOTE — Telephone Encounter (Signed)
Left detail massage on the cell phone for pt to call and make an appt with Dr. Ronnald Ramp and Rx faxed to the pharmacy.

## 2014-07-29 NOTE — Telephone Encounter (Signed)
New dose written She needs to be seen for a recheck on her weight and her blood pressure

## 2014-07-29 NOTE — Telephone Encounter (Signed)
Pt called stated she went to pick up her Qsymia but the dosage still 7.5. Pt stated she was on 7.5 last month and she did not think that this med is working and she was told that the dosage will increase when she pick up this one( it didnt not change). Please advise.

## 2014-07-30 ENCOUNTER — Telehealth: Payer: Self-pay | Admitting: *Deleted

## 2014-07-30 NOTE — Telephone Encounter (Signed)
Left msg on triage stating the Qysima is too expensive it $ 273. Wanting to see does med come in a generic, if not can md change to phentermine or something equivalent...Johny Chess

## 2014-08-01 MED ORDER — PHENTERMINE HCL 37.5 MG PO CAPS: 37.5000 mg | ORAL_CAPSULE | ORAL | Status: DC

## 2014-08-01 NOTE — Telephone Encounter (Signed)
Notified pt rx ready for pick-up.../lmb 

## 2014-08-01 NOTE — Telephone Encounter (Signed)
Pt call back requesting status on msg that was left on Tues. Pls advise...Meredith Long

## 2014-08-01 NOTE — Telephone Encounter (Signed)
Couldn't locate script so i re-printed...Meredith Long

## 2014-08-01 NOTE — Telephone Encounter (Signed)
done

## 2014-08-16 IMAGING — CT CT ABD-PELV W/ CM
1 of 2 series · 14 of 32 positions shown, 18 images · IV contrast (omnipaque)
Comparison: None available

CLINICAL DATA: Early onset right lower quadrant pain down in the
pelvis.

EXAM:
CT ABDOMEN AND PELVIS WITH CONTRAST
TECHNIQUE: Multidetector CT imaging of the abdomen and pelvis was performed
using the standard protocol following bolus administration of
intravenous contrast.
CONTRAST:  100mL OMNIPAQUE IOHEXOL 300 MG/ML  SOLN

[Series 2: routine abdomen/pelvis with · axial · 0.95mm/px · z∈[-666,-231]mm · 14 of 99 slices shown, 18 images]
[im 8/99  soft-tissue]
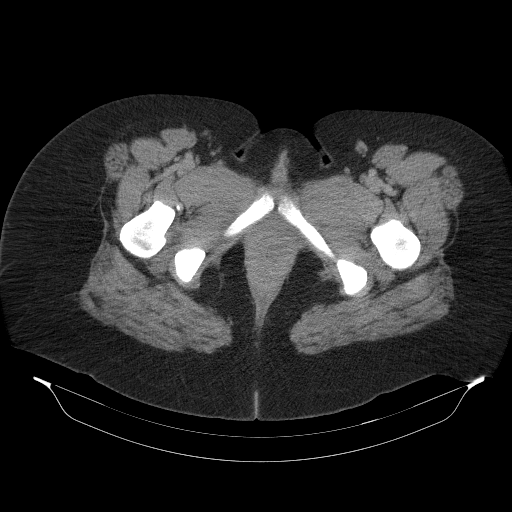
[im 8/99  bone]
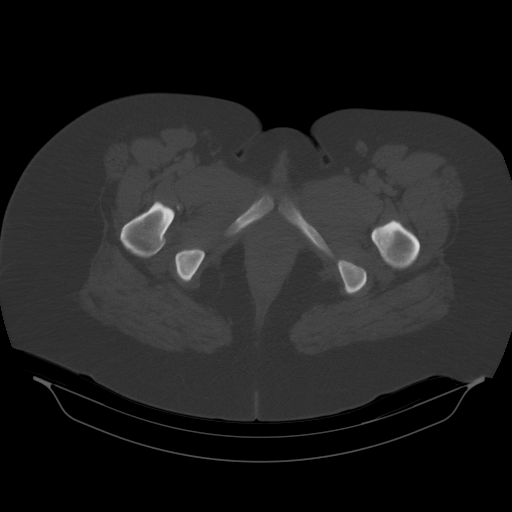
[im 16/99  soft-tissue]
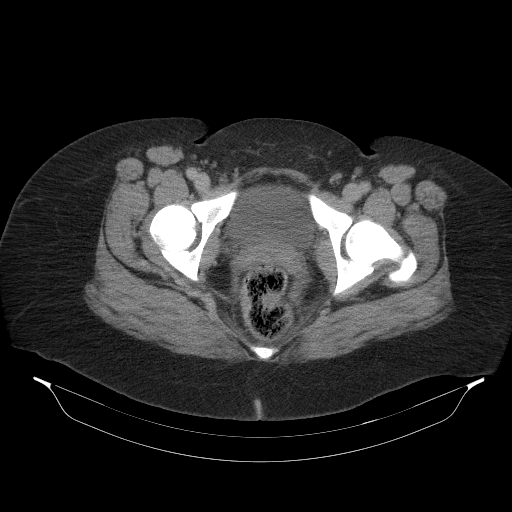
[im 23/99  soft-tissue]
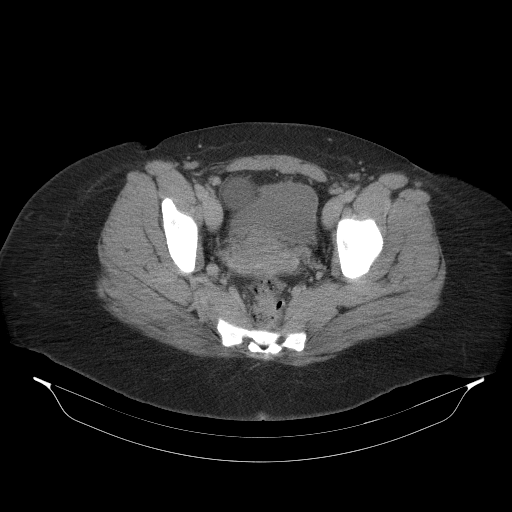
[im 31/99  soft-tissue]
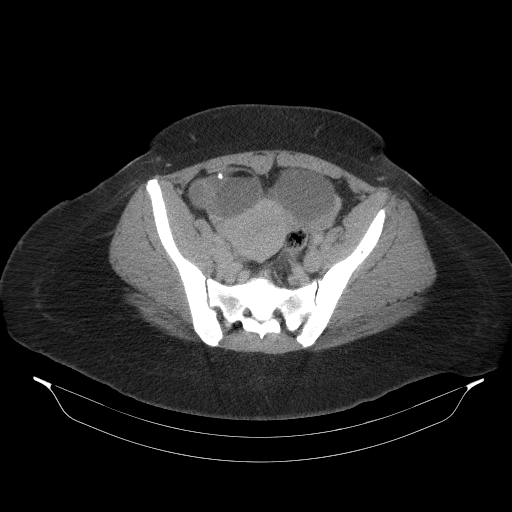
[im 38/99  soft-tissue]
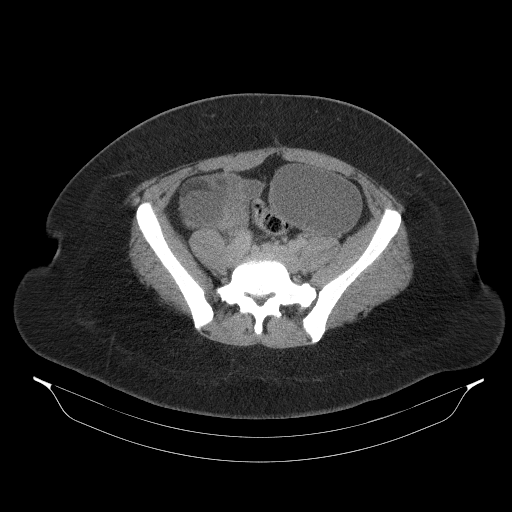
[im 46/99  soft-tissue]
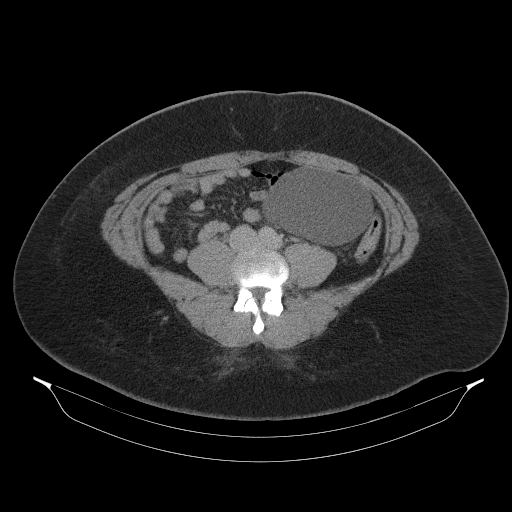
[im 53/99  soft-tissue]
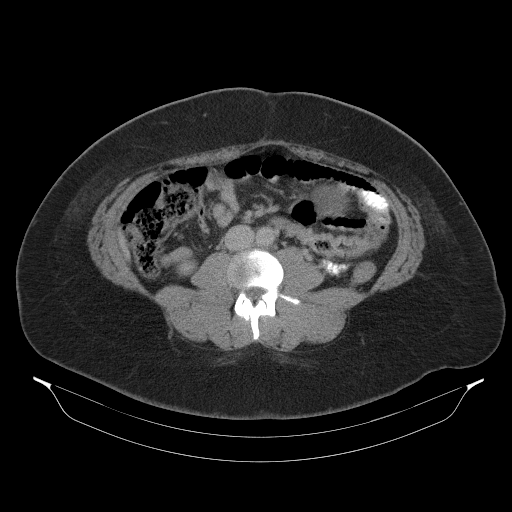
[im 61/99  soft-tissue]
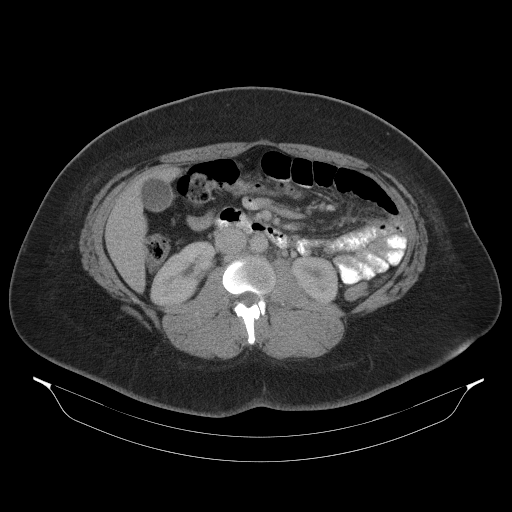
[im 68/99  soft-tissue]
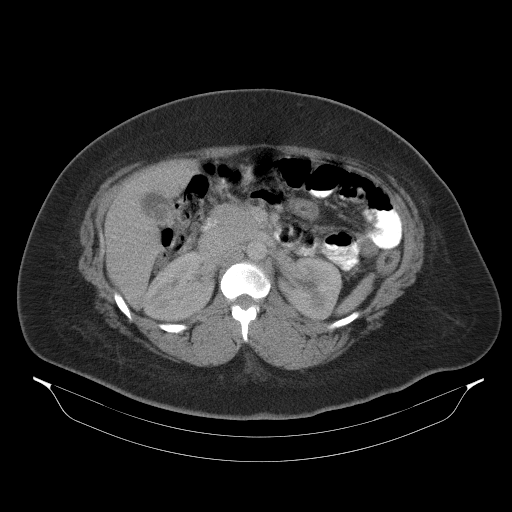
[im 68/99  bone]
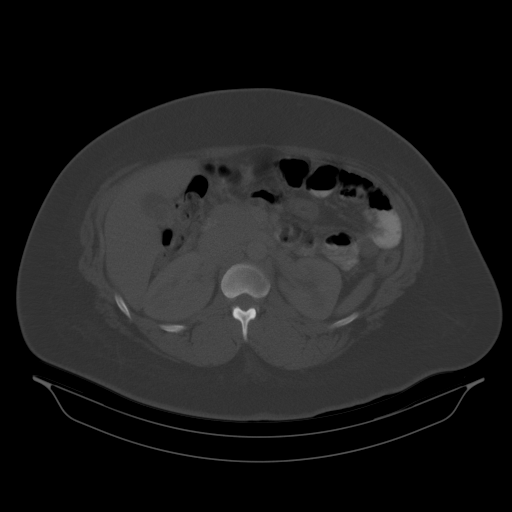
[im 76/99  soft-tissue]
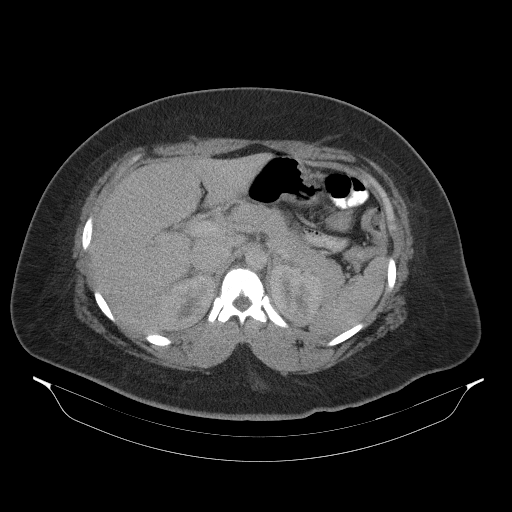
[im 83/99  soft-tissue]
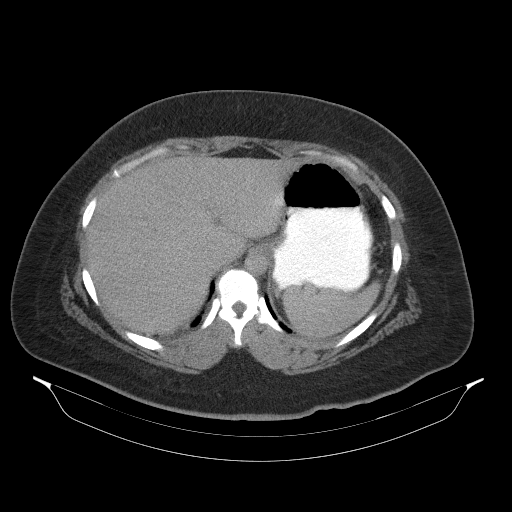
[im 83/99  lung]
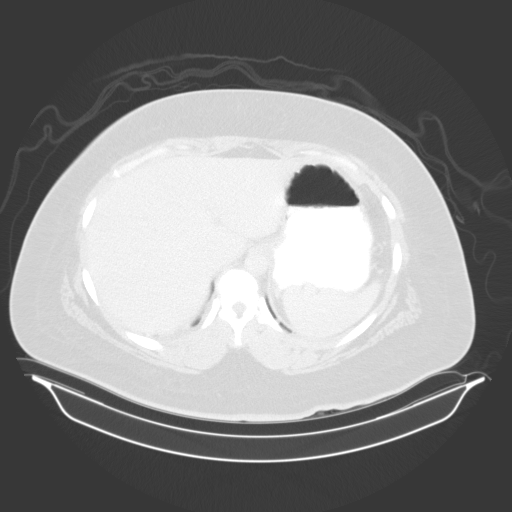
[im 87/99  lung]
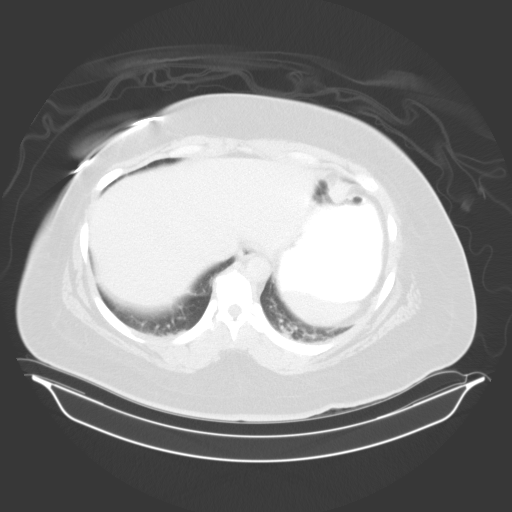
[im 91/99  soft-tissue]
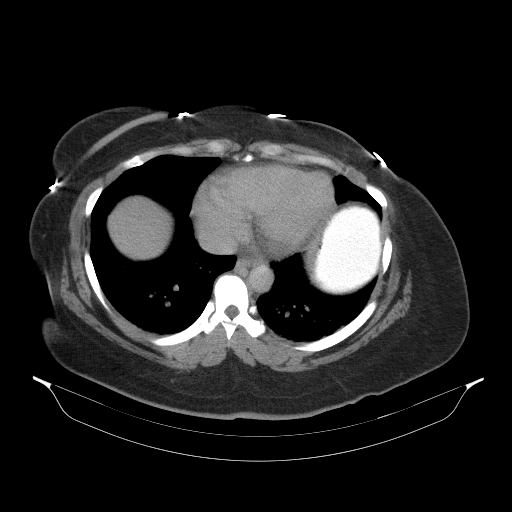
[im 91/99  lung]
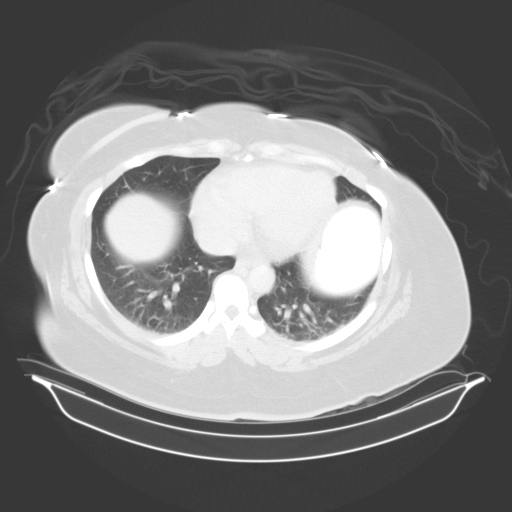
[im 95/99  lung]
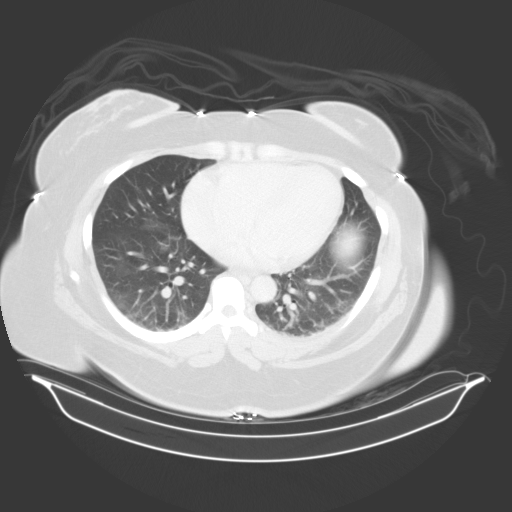

[14 of 32 positions shown; findings below may reference images not displayed]

FINDINGS: Mild subsegmental atelectasis is seen dependently within the lung
bases bilaterally. Visualized heart is within normal limits.

The liver demonstrates a normal contrast enhanced appearance.
Several mildly hyperdense stones are seen within the gallbladder. No
CT evidence of acute cholecystitis. No biliary ductal dilatation.

The spleen, adrenal glands, and pancreas demonstrate a normal
contrast enhanced appearance.

The kidneys are equal in size with symmetric enhancement. No
nephrolithiasis, hydronephrosis, or focal enhancing renal mass
identified.

Stomach is within normal limits. There is no evidence of bowel
obstruction. The appendix is well visualized in the right lower
quadrant and is of normal caliber and appearance without associated
inflammatory changes to suggest acute appendicitis. No abnormal wall
thickening or inflammatory fat stranding seen about the bowels.

A complex mass is seen within the right adnexal arising from the
right ovary. This lesion measures approximately 11.8 x 4.8 x 5.5 cm
(series 2, image 68). There is internal fat and calcific density
within this lesion. Finding is most suggestive of a dermoid. No free
fluid or inflammatory fat stranding seen within the right adnexa to
suggest associated right ovarian torsion.

An additional predominantly hypodense unilocular mass measuring
x 10.8 x 13.2 cm is seen arising from the left ovary (series 2,
image 56). A 7 mm calcific density is seen internally within this
lesion (image 64). Focal hypodensity adjacent to this calcification
may represent a small amount of fat versus artifact. Finding is
suggestive of an additional left ovarian dermoid.

The bladder is displaced inferiorly within the lower pelvis but is
otherwise unremarkable. Uterus is within normal limits.

No free air or fluid identified.

No pathologically enlarged intra-abdominal pelvic lymph nodes.

Osseous structures are within normal limits. No focal lytic or
blastic osseous lesions. Sacralization of the left aspect of L5 is
noted.

The left ovary is within normal limits.
IMPRESSION: 1. Complex right adnexal mass with internal fat and calcific
density, most consistent with a dermoid. No CT evidence of
associated ovarian torsion.
2. Hypodense unilocular left ovarian mass with internal
calcification as above. Finding is most suggestive of an additional
left ovarian dermoid. No CT evidence of associated ovarian torsion.
These findings could be further evaluated with pelvic MRI as
clinically indicated.
3. No CT evidence of acute intra-abdominal or pelvic process. Normal
appendix.
4. Cholelithiasis without evidence of acute cholecystitis.

## 2014-09-09 ENCOUNTER — Ambulatory Visit: Payer: 59 | Admitting: Dietician

## 2014-10-14 ENCOUNTER — Telehealth: Payer: Self-pay | Admitting: *Deleted

## 2014-10-14 DIAGNOSIS — Z30013 Encounter for initial prescription of injectable contraceptive: Secondary | ICD-10-CM

## 2014-10-14 NOTE — Telephone Encounter (Signed)
Patient is requesting to have her Nexplanon removed. Patient scheduled for removal on 10-28-14 @ 2:15 pm.  Patient states she would like to switch to the Depo.  Patient would like to know if we can send a prescription to the pharmacy so she can receive the Depo on 10-28-14.

## 2014-10-16 ENCOUNTER — Encounter: Payer: Self-pay | Admitting: Dietician

## 2014-10-16 ENCOUNTER — Encounter: Payer: Managed Care, Other (non HMO) | Attending: Internal Medicine | Admitting: Dietician

## 2014-10-16 DIAGNOSIS — Z6841 Body Mass Index (BMI) 40.0 and over, adult: Secondary | ICD-10-CM | POA: Insufficient documentation

## 2014-10-16 DIAGNOSIS — Z713 Dietary counseling and surveillance: Secondary | ICD-10-CM | POA: Insufficient documentation

## 2014-10-16 NOTE — Progress Notes (Signed)
Samples provided and patient instructed on proper use: PB2 (qty 2) Lot#: [id number] Exp: 07/2015  PB2 chocolate (qty 2) Lot#: none provided Exp: 10/2015

## 2014-10-16 NOTE — Progress Notes (Signed)
  Medical Nutrition Therapy:  Appt start time: 325 end time:  415  Assessment:  Primary concerns today: Meredith Long is here stating her PCP referred her here for her weight. She was prescribed phentermine 1 year ago and patient states that this worked well. She is currently on her second round and doesn't feel like it has worked as well. She reports she has lost 7 pounds in the last month and a half. 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 [name] states she tries to keep snack foods out of the house. Has lost a significant amount of weight previously but has currently reached a plateau.   Preferred Learning Style:  No preference indicated   Learning Readiness:   Ready   MEDICATIONS: phentermine   DIETARY INTAKE:  Avoided foods include mashed potatoes.    24-hr recall:  B ( AM): whey protein shake (powder with water and peanut butter) and hard boiled egg Snk ( AM): candy  L ( PM): chicken and vegetables OR salad with Ranch Snk ( PM): chips or apple or banana or candy bar D ( PM): spaghetti Snk ( PM): sometimes cereal if no dinner  Beverages: mostly water, soda at night occasionally  Usual physical activity: walks laps at work, 30-minute workout video   Estimated energy needs: 1600-1800 calories 180-200 g carbohydrates  Progress Towards Goal(s):  In progress.   Nutritional Diagnosis:  [place]-3.3 Overweight/obesity As related to excessive energy intake and inappropriate food choices.  As evidenced by BMI 42.    Intervention:  Nutrition counseling provided. -Avoid snacking on chips and candy  -Fill up on water  -Stash healthier snacks at work  -Pre-portion snacks into baggies -Limit portions of cereal  -Consider joining a gym and increasing workout intensity  -Zumba and spin classes  Breakfast: -Protein shake -Small bowl of cereal with boiled egg or [place] bacon  Lunch and dinner: -Unlimited non-starchy vegetables, 1/2  cup of starch, 3-4 oz of lean protein  Snacks: -Small amount of carbohydrate and protein  Teaching Method Utilized:  Visual Auditory Hands on  Handouts given during visit include:  MyPlate  60Y CHO + protein snacks  Diabetes meal plan (women)  Meal planning card  Barriers to learning/adherence to lifestyle change: none  Demonstrated degree of understanding via:  Teach Back   Monitoring/Evaluation:  Dietary intake, exercise, and body weight in 2 month(s).

## 2014-10-16 NOTE — Patient Instructions (Addendum)
-  Avoid snacking on chips and candy  -Fill up on water  -Stash healthier snacks at work  -Pre-portion snacks into baggies -Limit portions of cereal  -Consider joining a gym and increasing workout intensity  -Zumba and spin classes  Breakfast: -Protein shake -Small bowl of cereal with boiled egg or [place] bacon  Lunch and dinner: -Unlimited non-starchy vegetables, 1/2 cup of starch, 3-4 oz of lean protein  Snacks: -Small amount of carbohydrate and protein

## 2014-10-20 NOTE — Telephone Encounter (Signed)
OK to send Rx for Depo provera

## 2014-10-21 ENCOUNTER — Encounter: Payer: Self-pay | Admitting: Dietician

## 2014-10-22 MED ORDER — MEDROXYPROGESTERONE ACETATE 150 MG/ML IM SUSP: 150.0000 mg | INTRAMUSCULAR | Status: DC

## 2014-10-22 NOTE — Telephone Encounter (Signed)
Prescription sent to pharmacy on file and patient notified

## 2014-10-28 ENCOUNTER — Ambulatory Visit (INDEPENDENT_AMBULATORY_CARE_PROVIDER_SITE_OTHER): Payer: Managed Care, Other (non HMO) | Admitting: Obstetrics & Gynecology

## 2014-10-28 VITALS — BP 111/75 | HR 85 | Temp 97.7°F

## 2014-10-28 DIAGNOSIS — Z3046 Encounter for surveillance of implantable subdermal contraceptive: Secondary | ICD-10-CM

## 2014-10-28 DIAGNOSIS — Z3049 Encounter for surveillance of other contraceptives: Secondary | ICD-10-CM

## 2014-10-28 DIAGNOSIS — Z30013 Encounter for initial prescription of injectable contraceptive: Secondary | ICD-10-CM

## 2014-10-28 MED ORDER — MEDROXYPROGESTERONE ACETATE 150 MG/ML IM SUSP
150.0000 mg | INTRAMUSCULAR | Status: AC
  Administered 2014-10-28 – 2015-07-08 (×4): 150 mg via INTRAMUSCULAR

## 2014-10-28 NOTE — Progress Notes (Signed)
NEXPLANON REMOVAL  BP 111/75 mmHg  Pulse 85  Temp(Src) 97.7 F (36.5 C)  Reasons  for removal:  Pt request   A timeout was performed confirming the patient, the procedure and allergy status. The patient's left  arm was palpated and the implant device located. The area was prepped with Betadinex3. The distal end of the device was palpated and 2 cc of 1% lidocaine was injected. A 5 mm incision was made. Any fibrotic tissue was carefully dissected away using blunt and/or sharp dissection. The device was removed in an intact manner. Two interrupted sutures of 3-0 Vicryl were placed. Steri-strips and a sterile dressing were applied to the incision. The patient tolerated the procedure well.  New contraceptive method: Depo-Provera

## 2014-10-30 ENCOUNTER — Ambulatory Visit: Payer: Self-pay | Admitting: Obstetrics & Gynecology

## 2014-11-07 ENCOUNTER — Ambulatory Visit: Payer: Managed Care, Other (non HMO) | Admitting: Obstetrics & Gynecology

## 2014-12-16 ENCOUNTER — Encounter: Payer: Self-pay | Admitting: *Deleted

## 2014-12-17 ENCOUNTER — Encounter: Payer: Self-pay | Admitting: Obstetrics & Gynecology

## 2015-01-06 ENCOUNTER — Ambulatory Visit: Payer: 59 | Admitting: Dietician

## 2015-01-16 ENCOUNTER — Ambulatory Visit: Payer: Managed Care, Other (non HMO)

## 2015-01-16 ENCOUNTER — Ambulatory Visit: Payer: Managed Care, Other (non HMO) | Admitting: Obstetrics & Gynecology

## 2015-01-17 ENCOUNTER — Ambulatory Visit (INDEPENDENT_AMBULATORY_CARE_PROVIDER_SITE_OTHER): Payer: Managed Care, Other (non HMO) | Admitting: *Deleted

## 2015-01-17 VITALS — BP 111/75 | HR 80 | Temp 98.3°F | Ht 64.0 in | Wt 244.0 lb

## 2015-01-17 DIAGNOSIS — Z3042 Encounter for surveillance of injectable contraceptive: Secondary | ICD-10-CM

## 2015-01-17 DIAGNOSIS — Z30013 Encounter for initial prescription of injectable contraceptive: Secondary | ICD-10-CM

## 2015-01-17 NOTE — Progress Notes (Signed)
Patient in office today for a Depo injection. Patient is on time for her injection. Patient tolerated injection well.  Patient due for her next injection on April 11, 2015.   BP 111/75 mmHg  Pulse 80  Temp(Src) 98.3 F (36.8 C)  Ht 5\' 4"  (1.626 m)  Wt 244 lb (110.678 kg)  BMI 41.86 kg/m2   Administrations This Visit    medroxyPROGESTERone (DEPO-PROVERA) injection 150 mg    Administered Action Dose Route Administered By         01/17/2015 Given 150 mg Intramuscular Sherrin Daisy, LPN

## 2015-01-23 ENCOUNTER — Telehealth: Payer: Self-pay | Admitting: *Deleted

## 2015-01-23 NOTE — Telephone Encounter (Signed)
Patient was in the office on 01-17-15 for a Depo injection. Patient stated at that time she had been bleeding for 3 weeks. Patient advised would call after speaking with Dr. Jodi Mourning. Per Dr. Jodi Mourning patient to keep an eye on bleeding and if it continue we would give a prescription for birth control pills to stop the bleeding. Notified patient of Dr. Jacelyn Grip recommendation and patient verbalized understanding.Patient states she has stopped bleeding currently. Patient advised to call if bleeding started again.

## 2015-01-27 ENCOUNTER — Telehealth: Payer: Self-pay | Admitting: Obstetrics

## 2015-02-03 NOTE — Telephone Encounter (Signed)
02/03/2015 - Patient to call back with work schedule. brm

## 2015-03-24 ENCOUNTER — Telehealth: Payer: Self-pay | Admitting: *Deleted

## 2015-03-24 NOTE — Telephone Encounter (Signed)
Patient states she is using the Depo for birth control. Patient states she had BTB last month before her shot was due. It stopped after she got her injection. Patient is due her injection at the end of the month. She is bleeding heavily for 2 days now (she has been through a pack of 18 pads). Patient wants to continue the Depo- this will be her 3rd shot. Patient would like something to shut down her bleeding. Will speak to provider and let her know plan- patient has changed pharmacy to [facility].

## 2015-03-26 ENCOUNTER — Other Ambulatory Visit: Payer: Self-pay | Admitting: Obstetrics

## 2015-03-26 DIAGNOSIS — N939 Abnormal uterine and vaginal bleeding, unspecified: Secondary | ICD-10-CM

## 2015-03-26 DIAGNOSIS — Z3042 Encounter for surveillance of injectable contraceptive: Secondary | ICD-10-CM

## 2015-03-26 MED ORDER — NORETHINDRONE-ETH ESTRADIOL 1-35 MG-MCG PO TABS: 1.0000 | ORAL_TABLET | Freq: Every day | ORAL | Status: DC

## 2015-04-11 ENCOUNTER — Ambulatory Visit: Payer: Managed Care, Other (non HMO)

## 2015-04-14 ENCOUNTER — Ambulatory Visit (INDEPENDENT_AMBULATORY_CARE_PROVIDER_SITE_OTHER): Payer: Managed Care, Other (non HMO) | Admitting: *Deleted

## 2015-04-14 VITALS — BP 117/77 | HR 66 | Temp 97.2°F | Ht 65.0 in | Wt 256.0 lb

## 2015-04-14 DIAGNOSIS — Z3042 Encounter for surveillance of injectable contraceptive: Secondary | ICD-10-CM | POA: Diagnosis not present

## 2015-04-14 DIAGNOSIS — Z30013 Encounter for initial prescription of injectable contraceptive: Secondary | ICD-10-CM

## 2015-04-14 NOTE — Progress Notes (Signed)
Patient in office today for a Depo Injection. Patient is on time for her injection. Patient tolerated injection well. Patient is due for next injection on July 06, 2015   BP 117/77 mmHg  Pulse 66  Temp(Src) 97.2 F (36.2 C)  Ht 5\' 5"  (1.651 m)  Wt 256 lb (116.121 kg)  BMI 42.60 kg/m2   Administrations This Visit    medroxyPROGESTERone (DEPO-PROVERA) injection 150 mg    Admin Date Action Dose Route Administered By         04/14/2015 Given 150 mg Intramuscular Carole Binning, LPN

## 2015-04-18 ENCOUNTER — Ambulatory Visit: Payer: Managed Care, Other (non HMO)

## 2015-07-07 ENCOUNTER — Ambulatory Visit: Payer: Managed Care, Other (non HMO)

## 2015-07-08 ENCOUNTER — Ambulatory Visit (INDEPENDENT_AMBULATORY_CARE_PROVIDER_SITE_OTHER): Payer: Managed Care, Other (non HMO) | Admitting: *Deleted

## 2015-07-08 VITALS — BP 119/85 | HR 86 | Temp 98.3°F | Ht 65.0 in | Wt 243.0 lb

## 2015-07-08 DIAGNOSIS — Z3042 Encounter for surveillance of injectable contraceptive: Secondary | ICD-10-CM | POA: Diagnosis not present

## 2015-07-08 NOTE — Progress Notes (Signed)
Patient in office today for a Depo Injection. Patient is on time for her injection.  Patient tolerated injection well.  Patient due for next injection on September 29, 2015.  BP 119/85 mmHg  Pulse 86  Temp(Src) 98.3 F (36.8 C)  Ht 5\' 5"  (1.651 m)  Wt 243 lb (110.224 kg)  BMI 40.44 kg/m2  Administrations This Visit    medroxyPROGESTERone (DEPO-PROVERA) injection 150 mg    Admin Date Action Dose Route Administered By         07/08/2015 Given 150 mg Intramuscular Carole Binning, LPN

## 2015-09-23 ENCOUNTER — Ambulatory Visit: Payer: Managed Care, Other (non HMO)

## 2015-09-23 ENCOUNTER — Other Ambulatory Visit: Payer: Self-pay | Admitting: *Deleted

## 2015-09-23 DIAGNOSIS — Z30013 Encounter for initial prescription of injectable contraceptive: Secondary | ICD-10-CM

## 2015-09-23 MED ORDER — MEDROXYPROGESTERONE ACETATE 150 MG/ML IM SUSP: 150.0000 mg | INTRAMUSCULAR | Status: DC

## 2015-09-25 ENCOUNTER — Ambulatory Visit: Payer: Managed Care, Other (non HMO)

## 2015-09-30 ENCOUNTER — Ambulatory Visit (INDEPENDENT_AMBULATORY_CARE_PROVIDER_SITE_OTHER): Payer: Managed Care, Other (non HMO) | Admitting: Certified Nurse Midwife

## 2015-09-30 ENCOUNTER — Ambulatory Visit: Payer: Managed Care, Other (non HMO) | Admitting: Certified Nurse Midwife

## 2015-09-30 ENCOUNTER — Encounter: Payer: Self-pay | Admitting: Certified Nurse Midwife

## 2015-09-30 VITALS — BP 114/80 | HR 79

## 2015-09-30 DIAGNOSIS — Z3202 Encounter for pregnancy test, result negative: Secondary | ICD-10-CM | POA: Diagnosis not present

## 2015-09-30 DIAGNOSIS — Z3042 Encounter for surveillance of injectable contraceptive: Secondary | ICD-10-CM

## 2015-09-30 DIAGNOSIS — N939 Abnormal uterine and vaginal bleeding, unspecified: Secondary | ICD-10-CM

## 2015-09-30 LAB — POCT URINE PREGNANCY: Preg Test, Ur: NEGATIVE

## 2015-09-30 MED ORDER — MEDROXYPROGESTERONE ACETATE 150 MG/ML IM SUSP
150.0000 mg | Freq: Once | INTRAMUSCULAR | Status: AC
  Administered 2015-09-30: 150 mg via INTRAMUSCULAR

## 2015-09-30 NOTE — Progress Notes (Signed)
Patient ID: Meredith Long, female   DOB: 12/01/1981, 34 y.o.   MRN: [id number]  Subjective:    Meredith Long is a 34 y.o. female who presents for contraception counseling. Was originally here for annual exam.  Is currently on her period.  Is usually amenorrheic for her periods d/t depo shot.  The patient has no complaints today. The patient is sexually active, same partner. Pertinent past medical history: none.  Has been on Depo injections for about a year, denies any weight gain on Depo.  Has used Nexplanon in the past, had irregular bleeding.  On Depo for birth control and control of AUB.  Her current period started 2 weeks ago and she has had heavy bleeding since her cycle started.  She has been current on her Depo injections.    The information documented in the HPI was reviewed and verified.  Menstrual History: OB History    Gravida Para Term Preterm AB TAB SAB Ectopic Multiple Living   3 2 2  1 1    2       Menarche age: 34 years of age  Patient's last menstrual period was 09/14/2015.   Patient Active Problem List   Diagnosis Date Noted  . Routine general medical examination at a health care facility 06/28/2014  . Prediabetes 06/23/2012  . Severe obesity (BMI >= 40) ([facility]) 06/23/2012   Past Medical History  Diagnosis Date  . GERD (gastroesophageal reflux disease)   . Kidney infection   . Herpes   . Obesity     Past Surgical History  Procedure Laterality Date  . Dilation and curettage of uterus    . Laparotomy N/A 12/17/2013    Procedure: EXPLORATORY LAPAROTOMY;  Surgeon: Lahoma Crocker, MD;  Location: [facility];  Service: Gynecology;  Laterality: N/A;  . Salpingoophorectomy Right 12/17/2013    Procedure: SALPINGO OOPHORECTOMY;  Surgeon: Lahoma Crocker, MD;  Location: [facility];  Service: Gynecology;  Laterality: Right;  . Ovarian cyst removal Left 12/17/2013    Procedure: OVARIAN CYSTECTOMY;  Surgeon: Lahoma Crocker, MD;  Location: [facility];  Service: Gynecology;   Laterality: Left;     Current outpatient prescriptions:  .  medroxyPROGESTERone (DEPO-PROVERA) 150 MG/ML injection, Inject 1 mL (150 mg total) into the muscle every 3 (three) months., Disp: 1 mL, Rfl: 0 .  norethindrone-ethinyl estradiol 1/35 ([place] 1/35, 28,) tablet, Take 1 tablet by mouth daily. (Patient not taking: Reported on 07/08/2015), Disp: 1 Package, Rfl: 0 .  phentermine 37.5 MG capsule, Take 37.5 mg by mouth every morning., Disp: , Rfl:  .  topiramate (TOPAMAX) 25 MG tablet, Take 25 mg by mouth 2 (two) times daily., Disp: , Rfl:  Allergies  Allergen Reactions  . Penicillins Other (See Comments)    Does not know    Social History  Substance Use Topics  . Smoking status: Never Smoker   . Smokeless tobacco: Never Used  . Alcohol Use: No    Family History  Problem Relation Age of Onset  . Hyperlipidemia Other   . Hypertension Other   . Diabetes Other   . Hypertension Mother   . Obesity Other        Review of Systems Constitutional: negative for weight loss Genitourinary:+ for abnormal menstrual periods and negative for change in vaginal discharge   Objective:   BP 114/80 mmHg  Pulse 79  LMP 09/14/2015   General:   alert  Skin:   no rash or abnormalities  Lungs:   clear to auscultation bilaterally  Heart:  regular rate and rhythm, S1, S2 normal, no murmur, click, rub or gallop  Breasts:   deferred  Abdomen:  normal findings: no organomegaly, soft, non-tender and no hernia  Pelvis:  Deferred   Lab Review Urine pregnancy test Labs reviewed yes Radiologic studies reviewed yes  50% of 25 min visit spent on counseling and coordination of care.   Assessment:    34 y.o., continuing Depo-Provera injections, no contraindications.   Plan:    All questions answered.  No orders of the defined types were placed in this encounter.   No orders of the defined types were placed in this encounter.   Need to obtain previous records Follow up as  needed. Schedule for annual exam within the next month

## 2015-09-30 NOTE — Progress Notes (Signed)
Pt was given depo at today's visit.  Pt tolerated injection well.  Pt was advised to RTO on January2,2017 for next injection.  Administrations This Visit    medroxyPROGESTERone (DEPO-PROVERA) injection 150 mg    Admin Date Action Dose Route Administered By         09/30/2015 Given 150 mg Intramuscular Valene Bors, CMA

## 2015-10-01 LAB — GC/CHLAMYDIA PROBE AMP
CT Probe RNA: NEGATIVE
GC Probe RNA: NEGATIVE

## 2015-10-23 ENCOUNTER — Ambulatory Visit: Payer: Managed Care, Other (non HMO) | Admitting: Certified Nurse Midwife

## 2015-11-18 ENCOUNTER — Encounter: Payer: Self-pay | Admitting: Certified Nurse Midwife

## 2015-11-18 ENCOUNTER — Ambulatory Visit (INDEPENDENT_AMBULATORY_CARE_PROVIDER_SITE_OTHER): Payer: Managed Care, Other (non HMO) | Admitting: Certified Nurse Midwife

## 2015-11-18 VITALS — BP 127/84 | HR 79 | Temp 98.4°F | Ht 66.0 in | Wt 253.0 lb

## 2015-11-18 DIAGNOSIS — Z01419 Encounter for gynecological examination (general) (routine) without abnormal findings: Secondary | ICD-10-CM

## 2015-11-18 DIAGNOSIS — Z30013 Encounter for initial prescription of injectable contraceptive: Secondary | ICD-10-CM

## 2015-11-18 DIAGNOSIS — Z113 Encounter for screening for infections with a predominantly sexual mode of transmission: Secondary | ICD-10-CM

## 2015-11-18 MED ORDER — MEDROXYPROGESTERONE ACETATE 150 MG/ML IM SUSP: 150.0000 mg | INTRAMUSCULAR | Status: DC

## 2015-11-18 NOTE — Progress Notes (Signed)
Patient ID: Meredith Long, female   DOB: [birth year], 34 y.o.   MRN: [id number]    Subjective:        Meredith Long is a 34 y.o. female here for a routine exam.  Current complaints: none.  Desires to stay on depo provera injections.  Periods are spotting occasionally,  Improved with Depo injections.  Currently sexually active.  Employed.  Desires blood STD screening exam.  Hx of right ovarian torsion.    Personal health questionnaire:  Is patient Ashkenazi Jewish, have a family history of breast and/or ovarian cancer: no Is there a family history of uterine cancer diagnosed at age < 47, gastrointestinal cancer, urinary tract cancer, family member who is a Field seismologist syndrome-associated carrier: no Is the patient overweight and hypertensive, family history of diabetes, personal history of gestational diabetes, preeclampsia or PCOS: yes Is patient over 12, have PCOS,  family history of premature CHD under age 36, diabetes, smoke, have hypertension or peripheral artery disease:  Yes, MGM DM At any time, has a partner hit, kicked or otherwise hurt or frightened you?: no Over the past 2 weeks, have you felt down, depressed or hopeless?: no Over the past 2 weeks, have you felt little interest or pleasure in doing things?:no   Gynecologic History No LMP recorded. Patient has had an injection. Contraception: Depo-Provera injections Last Pap: 05/2012. Results were: normal Last mammogram: N/A.   Obstetric History OB History  Gravida Para Term Preterm AB SAB TAB Ectopic Multiple Living  3 2 2  1  1   2     # Outcome Date GA Lbr Len/2nd Weight Sex Delivery Anes PTL Lv  3 TAB 11/2012          2 Term 02/24/11 [redacted]w[redacted]d  9 lb (4.082 kg) F Vag-Spont EPI  Y  1 Term 09/28/09 [redacted]w[redacted]d  7 lb (3.175 kg) F Vag-Spont EPI  Y      Past Medical History  Diagnosis Date  . GERD (gastroesophageal reflux disease)   . Kidney infection   . Herpes   . Obesity     Past Surgical History  Procedure Laterality Date   . Dilation and curettage of uterus    . Laparotomy N/A 12/17/2013    Procedure: EXPLORATORY LAPAROTOMY;  Surgeon: Lahoma Crocker, MD;  Location: [facility];  Service: Gynecology;  Laterality: N/A;  . Salpingoophorectomy Right 12/17/2013    Procedure: SALPINGO OOPHORECTOMY;  Surgeon: Lahoma Crocker, MD;  Location: [facility];  Service: Gynecology;  Laterality: Right;  . Ovarian cyst removal Left 12/17/2013    Procedure: OVARIAN CYSTECTOMY;  Surgeon: Lahoma Crocker, MD;  Location: [facility];  Service: Gynecology;  Laterality: Left;     Current outpatient prescriptions:  .  medroxyPROGESTERone (DEPO-PROVERA) 150 MG/ML injection, Inject 1 mL (150 mg total) into the muscle every 3 (three) months., Disp: 1 mL, Rfl: 4 Allergies  Allergen Reactions  . Penicillins Other (See Comments)    Does not know    Social History  Substance Use Topics  . Smoking status: Never Smoker   . Smokeless tobacco: Never Used  . Alcohol Use: No    Family History  Problem Relation Age of Onset  . Hyperlipidemia Other   . Hypertension Other   . Diabetes Other   . Hypertension Mother   . Obesity Other       Review of Systems  Constitutional: negative for fatigue and weight loss Respiratory: negative for cough and wheezing Cardiovascular: negative for chest pain, fatigue and palpitations Gastrointestinal: negative for  abdominal pain and change in bowel habits Musculoskeletal:negative for myalgias Neurological: negative for gait problems and tremors Behavioral/Psych: negative for abusive relationship, depression Endocrine: negative for temperature intolerance   Genitourinary:negative for abnormal menstrual periods, genital lesions, hot flashes, sexual problems and vaginal discharge Integument/breast: negative for breast lump, breast tenderness, nipple discharge and skin lesion(s)    Objective:       BP 127/84 mmHg  Pulse 79  Temp(Src) 98.4 F (36.9 C)  Ht 5\' 6"  (1.676 m)  Wt 253 lb (114.76 kg)   BMI 40.85 kg/m2 General:   alert  Skin:   no rash or abnormalities  Lungs:   clear to auscultation bilaterally  Heart:   regular rate and rhythm, S1, S2 normal, no murmur, click, rub or gallop  Breasts:   normal without suspicious masses, skin or nipple changes or axillary nodes  Abdomen:  normal findings: no organomegaly, soft, non-tender and no hernia  Pelvis:  External genitalia: normal general appearance Urinary system: urethral meatus normal and bladder without fullness, nontender Vaginal: normal without tenderness, induration or masses Cervix: normal appearance Adnexa: normal bimanual exam Uterus: anteverted and non-tender, normal size   Lab Review Urine pregnancy test Labs reviewed yes Radiologic studies reviewed no  50% of 30 min visit spent on counseling and coordination of care.   Assessment:    Healthy female exam.   STD screening exam Plan:    Education reviewed: calcium supplements, depression evaluation, low fat, low cholesterol diet, safe sex/STD prevention, self breast exams, skin cancer screening and weight bearing exercise. Contraception: Depo-Provera injections. Follow up in: 1 year.   Meds ordered this encounter  Medications  . medroxyPROGESTERone (DEPO-PROVERA) 150 MG/ML injection    Sig: Inject 1 mL (150 mg total) into the muscle every 3 (three) months.    Dispense:  1 mL    Refill:  4   Orders Placed This Encounter  Procedures  . HIV antibody (with reflex)  . Hepatitis B surface antigen  . RPR  . Hepatitis C antibody    Possible management options include: [place] contraception

## 2015-11-19 LAB — HIV ANTIBODY (ROUTINE TESTING W REFLEX): HIV 1&2 Ab, 4th Generation: NONREACTIVE

## 2015-11-19 LAB — HEPATITIS B SURFACE ANTIGEN: Hepatitis B Surface Ag: NEGATIVE

## 2015-11-19 LAB — HEPATITIS C ANTIBODY: HCV Ab: NEGATIVE

## 2015-11-19 LAB — RPR

## 2015-11-20 LAB — PAP, TP IMAGING W/ HPV RNA, RFLX HPV TYPE 16,18/45: HPV MRNA, HIGH RISK: NOT DETECTED

## 2015-12-19 ENCOUNTER — Ambulatory Visit (INDEPENDENT_AMBULATORY_CARE_PROVIDER_SITE_OTHER): Payer: Managed Care, Other (non HMO) | Admitting: *Deleted

## 2015-12-19 VITALS — BP 112/79 | HR 77 | Temp 98.5°F | Wt 256.0 lb

## 2015-12-19 DIAGNOSIS — Z3042 Encounter for surveillance of injectable contraceptive: Secondary | ICD-10-CM | POA: Diagnosis not present

## 2015-12-19 MED ORDER — MEDROXYPROGESTERONE ACETATE 150 MG/ML IM SUSP
150.0000 mg | INTRAMUSCULAR | Status: AC
  Administered 2015-12-19: 150 mg via INTRAMUSCULAR

## 2015-12-19 NOTE — Progress Notes (Signed)
Patient in office today for a Depo injection. Patient is on time for her injection. Patient tolerated injection well. Patient due for next Depo on March 11, 2016.   BP 112/79 mmHg  Pulse 77  Temp(Src) 98.5 F (36.9 C)  Wt 256 lb (116.121 kg)  Administrations This Visit    medroxyPROGESTERone (DEPO-PROVERA) injection 150 mg    Admin Date Action Dose Route Administered By         12/19/2015 Given 150 mg Intramuscular Carole Binning, LPN

## 2015-12-23 ENCOUNTER — Ambulatory Visit: Payer: Managed Care, Other (non HMO)

## 2016-03-12 ENCOUNTER — Ambulatory Visit: Payer: Managed Care, Other (non HMO)

## 2016-11-26 ENCOUNTER — Telehealth: Payer: Self-pay

## 2016-11-26 ENCOUNTER — Other Ambulatory Visit: Payer: Self-pay | Admitting: Obstetrics

## 2016-11-26 DIAGNOSIS — Z30013 Encounter for initial prescription of injectable contraceptive: Secondary | ICD-10-CM

## 2016-11-26 NOTE — Telephone Encounter (Signed)
Contacted pt about med refill.

## 2016-12-07 ENCOUNTER — Ambulatory Visit: Payer: Managed Care, Other (non HMO)

## 2016-12-07 ENCOUNTER — Ambulatory Visit: Payer: Managed Care, Other (non HMO) | Admitting: Certified Nurse Midwife

## 2017-02-07 ENCOUNTER — Encounter: Payer: Self-pay | Admitting: Obstetrics and Gynecology

## 2017-02-07 ENCOUNTER — Ambulatory Visit (INDEPENDENT_AMBULATORY_CARE_PROVIDER_SITE_OTHER): Payer: 59 | Admitting: Obstetrics and Gynecology

## 2017-02-07 VITALS — BP 127/84 | HR 87 | Ht 67.0 in | Wt 254.0 lb

## 2017-02-07 DIAGNOSIS — Z0000 Encounter for general adult medical examination without abnormal findings: Secondary | ICD-10-CM | POA: Diagnosis not present

## 2017-02-07 DIAGNOSIS — Z01419 Encounter for gynecological examination (general) (routine) without abnormal findings: Secondary | ICD-10-CM | POA: Diagnosis not present

## 2017-02-07 LAB — HM PAP SMEAR

## 2017-02-07 MED ORDER — NORGESTIMATE-ETH ESTRADIOL 0.25-35 MG-MCG PO TABS: 1.0000 | ORAL_TABLET | Freq: Every day | ORAL | 11 refills | Status: DC

## 2017-02-07 NOTE — Addendum Note (Signed)
Addended by: Maryruth Eve on: 02/07/2017 11:17 AM   Modules accepted: Orders

## 2017-02-07 NOTE — Progress Notes (Signed)
Subjective:     Meredith Long is a 36 y.o. female G2P2 with BMI 39 and amnorrhea induced secondary to depo-provera who is here for a comprehensive physical exam. The patient reports no complaints. She is sexually active with the sam partner without concerns. She desires to discontinue depo-provera and change to OCP. She denies pelvic pain or abnormal discharge  Past Medical History:  Diagnosis Date  . GERD (gastroesophageal reflux disease)   . Herpes   . Kidney infection   . Obesity    Past Surgical History:  Procedure Laterality Date  . DILATION AND CURETTAGE OF UTERUS    . LAPAROTOMY N/A 12/17/2013   Procedure: EXPLORATORY LAPAROTOMY;  Surgeon: Lahoma Crocker, MD;  Location: [facility];  Service: Gynecology;  Laterality: N/A;  . OVARIAN CYST REMOVAL Left 12/17/2013   Procedure: OVARIAN CYSTECTOMY;  Surgeon: Lahoma Crocker, MD;  Location: [facility];  Service: Gynecology;  Laterality: Left;  . SALPINGOOPHORECTOMY Right 12/17/2013   Procedure: SALPINGO OOPHORECTOMY;  Surgeon: Lahoma Crocker, MD;  Location: [facility];  Service: Gynecology;  Laterality: Right;   Family History  Problem Relation Age of Onset  . Hypertension Mother   . Hyperlipidemia Other   . Hypertension Other   . Diabetes Other   . Obesity Other     Social History   Social History  . Marital status: Single    Spouse name: N/A  . Number of children: N/A  . Years of education: N/A   Occupational History  . Not on file.   Social History Main Topics  . Smoking status: Never Smoker  . Smokeless tobacco: Never Used  . Alcohol use No  . Drug use: No  . Sexual activity: Yes    Partners: Male    Birth control/ protection: Injection   Other Topics Concern  . Not on file   Social History Narrative   Single   [place] Maintenance  Topic Date Due  . PAP SMEAR  10/24/2016  . INFLUENZA VACCINE  10/07/2017 (Originally 07/20/2016)  . TETANUS/TDAP  05/22/2022  . HIV Screening  Completed        Review of Systems Pertinent items are noted in HPI.   Objective:  Blood pressure 127/84, pulse 87, height 5\' 7"  (1.702 m), weight 254 lb (115.2 kg).     GENERAL: Well-developed, well-nourished female in no acute distress. Obese HEENT: Normocephalic, atraumatic. Sclerae anicteric.  NECK: Supple. Normal thyroid.  LUNGS: Clear to auscultation bilaterally.  HEART: Regular rate and rhythm. BREASTS: Symmetric in size. No palpable masses or lymphadenopathy, skin changes, or nipple drainage. ABDOMEN: Soft, nontender, nondistended. No organomegaly. PELVIC: Normal external female genitalia. Vagina is pink and rugated.  Normal discharge. Normal appearing cervix. Uterus is normal in size. No adnexal mass or tenderness. EXTREMITIES: No cyanosis, clubbing, or edema, 2+ distal pulses.    Assessment:    Healthy female exam.      Plan:    pap smear with cultures collected Fasting labs ordered Patient desires STD screen Patient encouraged to start performing monthly self breast exam Patient will be contacted with results Rx Sprintec provided RTC in 1 year or prn See After Visit Summary for Counseling Recommendations

## 2017-02-07 NOTE — Progress Notes (Signed)
Pt presents for annual exam. Pt due for Depo; however, wishes to discontinue and start BCP. Pt has questions regarding Nexplanon side effects. Pt agrees to STD testing if [facility] will cover.

## 2017-02-08 LAB — COMPREHENSIVE METABOLIC PANEL
A/G RATIO: 1.2 (ref 1.2–2.2)
ALK PHOS: 70 IU/L (ref 39–117)
ALT: 11 IU/L (ref 0–32)
AST: 14 IU/L (ref 0–40)
Albumin: 3.8 g/dL (ref 3.5–5.5)
BILIRUBIN TOTAL: 0.7 mg/dL (ref 0.0–1.2)
BUN/Creatinine Ratio: 8 — ABNORMAL LOW (ref 9–23)
BUN: 6 mg/dL (ref 6–20)
CALCIUM: 9 mg/dL (ref 8.7–10.2)
CHLORIDE: 102 mmol/L (ref 96–106)
CO2: 25 mmol/L (ref 18–29)
Creatinine, Ser: 0.74 mg/dL (ref 0.57–1.00)
GFR calc Af Amer: 122 mL/min/{1.73_m2} (ref >59)
GFR, EST NON AFRICAN AMERICAN: 106 mL/min/{1.73_m2} (ref >59)
GLOBULIN, TOTAL: 3.2 g/dL (ref 1.5–4.5)
Glucose: 79 mg/dL (ref 65–99)
Potassium: 3.9 mmol/L (ref 3.5–5.2)
SODIUM: 140 mmol/L (ref 134–144)
Total Protein: 7 g/dL (ref 6.0–8.5)

## 2017-02-08 LAB — CBC
HEMOGLOBIN: 13.5 g/dL (ref 11.1–15.9)
Hematocrit: 43.2 % (ref 34.0–46.6)
MCH: 25.8 pg — AB (ref 26.6–33.0)
MCHC: 31.3 g/dL — AB (ref 31.5–35.7)
MCV: 82 fL (ref 79–97)
Platelets: 218 10*3/uL (ref 150–379)
RBC: 5.24 x10E6/uL (ref 3.77–5.28)
RDW: 15 % (ref 12.3–15.4)
WBC: 6.4 10*3/uL (ref 3.4–10.8)

## 2017-02-08 LAB — HEMOGLOBIN A1C
ESTIMATED AVERAGE GLUCOSE: 108 mg/dL
Hgb A1c MFr Bld: 5.4 % (ref 4.8–5.6)

## 2017-02-08 LAB — LIPID PANEL
Chol/HDL Ratio: 2.7 ratio units (ref 0.0–4.4)
Cholesterol, Total: 109 mg/dL (ref 100–199)
HDL: 41 mg/dL (ref >39)
LDL Calculated: 62 mg/dL (ref 0–99)
Triglycerides: 32 mg/dL (ref 0–149)
VLDL CHOLESTEROL CAL: 6 mg/dL (ref 5–40)

## 2017-02-08 LAB — HIV ANTIBODY (ROUTINE TESTING W REFLEX): HIV Screen 4th Generation wRfx: NONREACTIVE

## 2017-02-08 LAB — TSH: TSH: 0.674 u[IU]/mL (ref 0.450–4.500)

## 2017-02-08 LAB — RPR: RPR: NONREACTIVE

## 2017-02-09 LAB — CYTOLOGY - PAP
ADEQUACY: ABSENT
CHLAMYDIA, DNA PROBE: NEGATIVE
DIAGNOSIS: NEGATIVE
HPV: NOT DETECTED
NEISSERIA GONORRHEA: NEGATIVE

## 2017-02-10 ENCOUNTER — Other Ambulatory Visit: Payer: Self-pay | Admitting: Obstetrics

## 2017-03-01 ENCOUNTER — Telehealth: Payer: Self-pay | Admitting: *Deleted

## 2017-03-01 NOTE — Telephone Encounter (Signed)
Patient is taking her first pack of birth control pills. Patient is calling to report that during the second week she started bleeding intermittent then for the past 1 1/2 she has been bleeding daily. She is wearing a pad. She is on her withdrawal week now. She is taking them the same time every day and has not missed. This being her first pack- I told her her body may be in an adjustment period- but I would send a message to Dr Elly Modena for her advisement.

## 2017-03-03 ENCOUNTER — Telehealth: Payer: Self-pay | Admitting: *Deleted

## 2017-03-03 NOTE — Telephone Encounter (Signed)
Patient is calling to see if we have heard back from her provider- told patient I would message her and let her know she was waiting for a response.

## 2017-03-03 NOTE — Telephone Encounter (Signed)
I apologize for the delayed response. Following depo-provera it is difficult to determine where in her cycle she is and thus irregular bleeding is expected with the first pack of birth control. She should continue taking the pill as directed. She is expected to only have vaginal bleeding during the placebo pills of the second pack. If irregular vaginal bleeding persists with the 3rd pack of pill she should schedule an appointment.  Zion Lint

## 2017-03-04 NOTE — Telephone Encounter (Signed)
LM to CB on Monday

## 2017-03-07 NOTE — Telephone Encounter (Signed)
Patient notified

## 2017-06-14 ENCOUNTER — Telehealth: Payer: Self-pay | Admitting: Obstetrics and Gynecology

## 2017-06-15 ENCOUNTER — Telehealth: Payer: Self-pay

## 2017-06-15 MED ORDER — FLUCONAZOLE 150 MG PO TABS: 150.0000 mg | ORAL_TABLET | Freq: Once | ORAL | 0 refills | Status: AC

## 2017-06-15 NOTE — Telephone Encounter (Signed)
Pt c/o possible yeast infection c/o outer vaginal itching, denies abnormal discharge. Has tried OTC meds w/o relief. Pt states she spoke with someone on Monday requesting meds but has not heard anything back yet. Pt does not remember name and there is no documentation on file. Allergies verified. Diflucan sent to [facility] per protocol. Pt aware if sx's do not subside, please contact the office.

## 2017-06-20 ENCOUNTER — Telehealth: Payer: Self-pay | Admitting: *Deleted

## 2017-06-20 ENCOUNTER — Other Ambulatory Visit: Payer: Self-pay | Admitting: *Deleted

## 2017-06-20 DIAGNOSIS — B379 Candidiasis, unspecified: Secondary | ICD-10-CM

## 2017-06-20 MED ORDER — MICONAZOLE NITRATE 2 % VA CREA: 1.0000 | TOPICAL_CREAM | Freq: Every day | VAGINAL | 0 refills | Status: DC

## 2017-06-20 MED ORDER — FLUCONAZOLE 150 MG PO TABS: 150.0000 mg | ORAL_TABLET | Freq: Once | ORAL | 0 refills | Status: AC

## 2017-06-20 NOTE — Telephone Encounter (Signed)
LM on pt VM making her aware of Rx sent to pharmacy

## 2017-06-20 NOTE — Progress Notes (Signed)
Orders sent to pharmacy per [facility] order.

## 2017-06-20 NOTE — Telephone Encounter (Signed)
Pt called to office stating our office treated her for yeast inf last week. Pt states she was no better after taking pill.  Pt states she then went to urgent care in [facility] and was told she had Bacterial inf. Pt states that she was given treatment and is still currently taking. Pt states that she is still have severe vaginal itching despite taking medication and using OTC cream. Pt would like to know if there is any other recommendations for treatment or if she could get another Rx cream for itching.  Pt uses [facility] [address] in [place].  Pt advised msg to be sent to provider for further recommendations.  Please advise

## 2018-01-19 ENCOUNTER — Encounter: Payer: Self-pay | Admitting: Certified Nurse Midwife

## 2018-01-19 ENCOUNTER — Ambulatory Visit (INDEPENDENT_AMBULATORY_CARE_PROVIDER_SITE_OTHER): Payer: 59 | Admitting: Certified Nurse Midwife

## 2018-01-19 VITALS — BP 115/80 | HR 85 | Wt 257.6 lb

## 2018-01-19 DIAGNOSIS — Z1151 Encounter for screening for human papillomavirus (HPV): Secondary | ICD-10-CM | POA: Diagnosis not present

## 2018-01-19 DIAGNOSIS — Z01419 Encounter for gynecological examination (general) (routine) without abnormal findings: Secondary | ICD-10-CM | POA: Diagnosis not present

## 2018-01-19 DIAGNOSIS — N898 Other specified noninflammatory disorders of vagina: Secondary | ICD-10-CM

## 2018-01-19 DIAGNOSIS — Z3041 Encounter for surveillance of contraceptive pills: Secondary | ICD-10-CM

## 2018-01-19 DIAGNOSIS — Z124 Encounter for screening for malignant neoplasm of cervix: Secondary | ICD-10-CM | POA: Diagnosis not present

## 2018-01-19 DIAGNOSIS — Z113 Encounter for screening for infections with a predominantly sexual mode of transmission: Secondary | ICD-10-CM

## 2018-01-19 MED ORDER — NORGESTIMATE-ETH ESTRADIOL 0.25-35 MG-MCG PO TABS: 1.0000 | ORAL_TABLET | Freq: Every day | ORAL | 11 refills | Status: DC

## 2018-01-19 NOTE — Progress Notes (Signed)
Subjective:        Meredith Long is a 37 y.o. female here for a routine exam.  Current complaints: had episodes of HSV outbreak recently.  Currently taking Sprintec OCPs, doing well, reports normal periods.  Denies any change in sexual partners.  Was previously on Depo injections.   104 & 20 year old children in school and doing well.  Is working full time.  Currently has braces on her teeth.  Does not exercise on a daily basis; exercise encouraged.    Personal health questionnaire:  Is patient Ashkenazi Jewish, have a family history of breast and/or ovarian cancer: no Is there a family history of uterine cancer diagnosed at age < 66, gastrointestinal cancer, urinary tract cancer, family member who is a Field seismologist syndrome-associated carrier: no Is the patient overweight and hypertensive, family history of diabetes, personal history of gestational diabetes, preeclampsia or PCOS: yes Is patient over 80, have PCOS,  family history of premature CHD under age 30, diabetes, smoke, have hypertension or peripheral artery disease:  no At any time, has a partner hit, kicked or otherwise hurt or frightened you?: no Over the past 2 weeks, have you felt down, depressed or hopeless?: no Over the past 2 weeks, have you felt little interest or pleasure in doing things?:no   Gynecologic History Patient's last menstrual period was 01/05/2018. Contraception: OCP (estrogen/progesterone) Last Pap: 02/07/17. Results were: normal; endocervical zone absent: repeated today Last mammogram: n/a <40 years, no significant family hx.   Obstetric History OB History  Gravida Para Term Preterm AB Living  3 2 2   1 2   SAB TAB Ectopic Multiple Live Births    1     2    # Outcome Date GA Lbr Len/2nd Weight Sex Delivery Anes PTL Lv  3 TAB 11/2012          2 Term 02/24/11 [redacted]w[redacted]d  9 lb (4.082 kg) F Vag-Spont EPI  LIV  1 Term 09/28/09 [redacted]w[redacted]d  7 lb (3.175 kg) F Vag-Spont EPI  LIV      Past Medical History:  Diagnosis Date   . GERD (gastroesophageal reflux disease)   . Herpes   . Kidney infection   . Obesity     Past Surgical History:  Procedure Laterality Date  . DILATION AND CURETTAGE OF UTERUS    . LAPAROTOMY N/A 12/17/2013   Procedure: EXPLORATORY LAPAROTOMY;  Surgeon: Lahoma Crocker, MD;  Location: [facility];  Service: Gynecology;  Laterality: N/A;  . OVARIAN CYST REMOVAL Left 12/17/2013   Procedure: OVARIAN CYSTECTOMY;  Surgeon: Lahoma Crocker, MD;  Location: [facility];  Service: Gynecology;  Laterality: Left;  . SALPINGOOPHORECTOMY Right 12/17/2013   Procedure: SALPINGO OOPHORECTOMY;  Surgeon: Lahoma Crocker, MD;  Location: [facility];  Service: Gynecology;  Laterality: Right;     Current Outpatient Medications:  .  norgestimate-ethinyl estradiol (ORTHO-CYCLEN,SPRINTEC,PREVIFEM) 0.25-35 MG-MCG tablet, Take 1 tablet by mouth daily., Disp: 1 Package, Rfl: 11 .  miconazole (MONISTAT 7 SIMPLY CURE) 2 % vaginal cream, Place 1 Applicatorful vaginally at bedtime. Also use externally for itching (Patient not taking: Reported on 01/19/2018), Disp: 45 g, Rfl: 0 Allergies  Allergen Reactions  . Penicillins Other (See Comments)    Does not know    Social History   Tobacco Use  . Smoking status: Never Smoker  . Smokeless tobacco: Never Used  Substance Use Topics  . Alcohol use: No    Alcohol/week: 0.0 oz    Family History  Problem Relation Age of  Onset  . Hypertension Mother   . Hyperlipidemia Other   . Hypertension Other   . Diabetes Other   . Obesity Other       Review of Systems  Constitutional: negative for fatigue and weight loss Respiratory: negative for cough and wheezing Cardiovascular: negative for chest pain, fatigue and palpitations Gastrointestinal: negative for abdominal pain and change in bowel habits Musculoskeletal:negative for myalgias Neurological: negative for gait problems and tremors Behavioral/Psych: negative for abusive relationship, depression Endocrine: negative  for temperature intolerance    Genitourinary:negative for abnormal menstrual periods, genital lesions, hot flashes, sexual problems and vaginal discharge Integument/breast: negative for breast lump, breast tenderness, nipple discharge and skin lesion(s)    Objective:       BP 115/80   Pulse 85   Wt 257 lb 9.6 oz (116.8 kg)   LMP 01/05/2018   BMI 40.35 kg/m  General:   alert  Skin:   no rash or abnormalities  Lungs:   clear to auscultation bilaterally  Heart:   regular rate and rhythm, S1, S2 normal, no murmur, click, rub or gallop  Breasts:   normal without suspicious masses, skin or nipple changes or axillary nodes  Abdomen:  normal findings: no organomegaly, soft, non-tender and no hernia  Pelvis:  External genitalia: normal general appearance Urinary system: urethral meatus normal and bladder without fullness, nontender Vaginal: normal without tenderness, induration or masses Cervix: normal appearance Adnexa: normal bimanual exam Uterus: anteverted and non-tender, normal size   Lab Review Urine pregnancy test Labs reviewed yes Radiologic studies reviewed no  50% of 30 min visit spent on counseling and coordination of care.   Assessment & Plan    Healthy female exam.    1. Well woman exam    - Cervicovaginal ancillary only  2. Vaginal discharge     - Cytology - PAP  3. Severe obesity (BMI >= 40) ([facility])      11 months ago A1C: normal.  Exercise encouraged.  Nutrition consult could be considered.   4. Encounter for surveillance of contraceptive pills    - norgestimate-ethinyl estradiol (ORTHO-CYCLEN,SPRINTEC,PREVIFEM) 0.25-35 MG-MCG tablet; Take 1 tablet by mouth daily.  Dispense: 1 Package; Refill: 11   Education reviewed: calcium supplements, depression evaluation, low fat, low cholesterol diet, safe sex/STD prevention, self breast exams, skin cancer screening and weight bearing exercise. Contraception: OCP (estrogen/progesterone). Follow up in: 1 year.    Meds ordered this encounter  Medications  . norgestimate-ethinyl estradiol (ORTHO-CYCLEN,SPRINTEC,PREVIFEM) 0.25-35 MG-MCG tablet    Sig: Take 1 tablet by mouth daily.    Dispense:  1 Package    Refill:  11     Follow up as needed.

## 2018-01-19 NOTE — Progress Notes (Signed)
Patient is in the office for annual exam, last pap 02-07-17. Pt declined trich std testing

## 2018-01-20 LAB — CYTOLOGY - PAP
Diagnosis: NEGATIVE
HPV: NOT DETECTED

## 2018-01-20 LAB — CERVICOVAGINAL ANCILLARY ONLY
Bacterial vaginitis: NEGATIVE
CANDIDA VAGINITIS: NEGATIVE
Chlamydia: NEGATIVE
Neisseria Gonorrhea: NEGATIVE

## 2018-01-24 ENCOUNTER — Other Ambulatory Visit: Payer: Self-pay | Admitting: Certified Nurse Midwife

## 2019-01-01 ENCOUNTER — Other Ambulatory Visit: Payer: Self-pay

## 2019-01-01 DIAGNOSIS — Z3041 Encounter for surveillance of contraceptive pills: Secondary | ICD-10-CM

## 2019-01-01 MED ORDER — NORGESTIMATE-ETH ESTRADIOL 0.25-35 MG-MCG PO TABS: 1.0000 | ORAL_TABLET | Freq: Every day | ORAL | 0 refills | Status: DC

## 2019-01-21 ENCOUNTER — Other Ambulatory Visit: Payer: Self-pay | Admitting: Obstetrics

## 2019-01-21 DIAGNOSIS — Z3041 Encounter for surveillance of contraceptive pills: Secondary | ICD-10-CM

## 2019-01-23 ENCOUNTER — Encounter: Payer: Self-pay | Admitting: Advanced Practice Midwife

## 2019-01-23 ENCOUNTER — Ambulatory Visit (INDEPENDENT_AMBULATORY_CARE_PROVIDER_SITE_OTHER): Payer: 59 | Admitting: Advanced Practice Midwife

## 2019-01-23 VITALS — BP 105/72 | HR 78 | Ht 67.0 in | Wt 280.8 lb

## 2019-01-23 DIAGNOSIS — Z3009 Encounter for other general counseling and advice on contraception: Secondary | ICD-10-CM | POA: Diagnosis not present

## 2019-01-23 DIAGNOSIS — Z01419 Encounter for gynecological examination (general) (routine) without abnormal findings: Secondary | ICD-10-CM | POA: Diagnosis not present

## 2019-01-23 DIAGNOSIS — Z113 Encounter for screening for infections with a predominantly sexual mode of transmission: Secondary | ICD-10-CM | POA: Diagnosis not present

## 2019-01-23 DIAGNOSIS — Z0000 Encounter for general adult medical examination without abnormal findings: Secondary | ICD-10-CM

## 2019-01-23 MED ORDER — MEDROXYPROGESTERONE ACETATE 150 MG/ML IM SUSP: 150.0000 mg | INTRAMUSCULAR | 4 refills | Status: DC

## 2019-01-23 NOTE — Progress Notes (Signed)
Pt presents for annual. Pt states she was told last visit she didn't need to undress for annual this year; therefore, she declines VE and breast exam.  Last pap 01/19/18 She requests to switch from pills to Depo.

## 2019-01-23 NOTE — Progress Notes (Signed)
Subjective:     Meredith Long is a 38 y.o. female here for a routine exam.  Current complaints: none. She desires to switch from OCPs back to Depo for contraception.  Personal health questionnaire reviewed: yes.   Gynecologic History Patient's last menstrual period was 01/07/2019. Contraception: OCP (estrogen/progesterone) Last Pap: 2019. Results were: normal Last mammogram: n/a.   Obstetric History OB History  Gravida Para Term Preterm AB Living  3 2 2   1 2   SAB TAB Ectopic Multiple Live Births    1     2    # Outcome Date GA Lbr Len/2nd Weight Sex Delivery Anes PTL Lv  3 TAB 11/2012          2 Term 02/24/11 [redacted]w[redacted]d  4082 g F Vag-Spont EPI  LIV  1 Term 09/28/09 [redacted]w[redacted]d  3175 g F Vag-Spont EPI  LIV     The following portions of the patient's history were reviewed and updated as appropriate: allergies, current medications, past family history, past medical history, past social history, past surgical history and problem list.  Review of Systems Pertinent items noted in HPI and remainder of comprehensive ROS otherwise negative.    Objective:   BP 105/72   Pulse 78   Ht 5\' 7"  (1.702 m)   Wt 127.4 kg   LMP 01/07/2019   BMI 43.98 kg/m   VS reviewed, nursing note reviewed,  Constitutional: well developed, well nourished, no distress HEENT: normocephalic HEART: normal rate, heart sounds, regular rhythm RESP: normal effort, lung sounds clear and equal bilaterally Abdomen: soft Neuro: alert and oriented x 3 Skin: warm, dry Psych: affect normal      Assessment/Plan:   1. Screening examination for STD (sexually transmitted disease)  - HIV Antibody (routine testing w rflx) - Urine cytology ancillary only([place])  2. Well woman exam (no gynecological exam) --Pt declines clinical breast exam.   --Discussed self breast exams, awareness of changes/reasons to seek care. --Pap 1 year ago wnl  3. Encounter for counseling regarding contraception --Discussed LARCs as  most effective forms of birth control.  Discussed benefits/risks of other methods.  Pt desires Depo Provera. Rx written, pt to return for injection.  - medroxyPROGESTERone (DEPO-PROVERA) 150 MG/ML injection; Inject 1 mL (150 mg total) into the muscle every 3 (three) months.  Dispense: 1 mL; Refill: 4    Follow up in: 3 months for injection or as needed.   Fatima Blank, CNM 10:47 AM

## 2019-01-23 NOTE — Patient Instructions (Addendum)
Preventing Pregnancy, Adult Pregnancy can occur any time you have sex. You can even become pregnant if you do not have a regular period or when you are breastfeeding. Using a form of birth control (contraception) that is best for you can help prevent pregnancy. Talk to your health care provider about the options available to you for preventing pregnancy. Work together to make a decision that is right for you based on your health, lifestyle, values, and preferences. What are options for pregnancy prevention? The only way to completely prevent pregnancy is not to have sex (practice abstinence). If you choose to be sexually active, you can use birth control every time you have sex. Birth control must be used exactly as prescribed by your health care provider, or as recommended by instructions on the package. You may consider the following options for birth control: Reversible prevention   Using a long-acting, reversible form of birth control, such as: ? An intrauterine device (IUD). ? An implantable or injectable hormonal birth control.  Taking birth control pills by mouth (oral pills).  Using a condom. These are most effective when used with another form of birth control, such as birth control pills or an IUD. Condoms also help protect against STIs (sexually transmitted infections).  Learning the signs of fertility and avoiding sex when you notice these signs. Signs may include: ? Increased vaginal discharge. ? Slight changes in body temperature.  Practicing natural family planning (rhythm method). This is the least effective method of preventing pregnancy. This option relies on knowing when you are most likely to release an egg (ovulate) and be most fertile. To be most effective, these methods must be used exactly as told by your health care provider. If you decide that you want to become pregnant, you can stop any of these methods at any time. Permanent prevention  A surgical procedure to  prevent pregnancy permanently (sterilization). In this surgery, the fallopian tubes are either blocked or closed off. This prevents eggs from reaching the uterus. Emergency prevention  Using emergency birth control as needed. This is to be used if you have sex without using birth control and you are concerned that you might be pregnant. Emergency birth control can be purchased from a pharmacy without a prescription. It can prevent pregnancy if taken up to 72 hours after having unprotected sex. ? If you have questions about emergency birth control, ask your health care provider. ? Emergency birth control should not be used on a regular basis. Where to find support You may be able to get support for preventing pregnancy from:  Clinics and health care providers who can educate you about birth control options. Some clinics offer services whose prices vary based on financial need (sliding scale). Most clinics take health insurance.  A clinic that offers reproductive services. You can find a clinic near you through the Department of Health and Human Services: BondedCompany.at Where can I get more information? Learn more about preventing pregnancy from:  Centers for Disease Control and Prevention: LargeLists.ch  MediumTube.co.za: ElectronicsManager.it Summary  The only completely effective way to prevent pregnancy is to avoid having sex.  Preventing pregnancy depends on finding the birth control method that works best for you. No matter which type of birth control you choose, it should be used correctly every time you have sex.  Condoms, which can be used for birth control, can also protect against STIs. This information is not intended to replace advice given to you by your health care provider. Make  sure you discuss any questions you have with your health care provider. Document Released: 12/07/2016 Document Revised: 12/07/2016  Document Reviewed: 12/07/2016 Elsevier Interactive Patient Education  2019 [place].   Medroxyprogesterone injection [Contraceptive] What is this medicine? MEDROXYPROGESTERONE (me DROX ee proe JES te rone) contraceptive injections prevent pregnancy. They provide effective birth control for 3 months. Depo-subQ Provera 104 is also used for treating pain related to endometriosis. This medicine may be used for other purposes; ask your health care provider or pharmacist if you have questions. COMMON BRAND NAME(S): Depo-Provera, Depo-subQ Provera 104 What should I tell my health care provider before I take this medicine? They need to know if you have any of these conditions: -frequently drink alcohol -asthma -blood vessel disease or a history of a blood clot in the lungs or legs -bone disease such as osteoporosis -breast cancer -diabetes -eating disorder (anorexia nervosa or bulimia) -high blood pressure -HIV infection or AIDS -kidney disease -liver disease -mental depression -migraine -seizures (convulsions) -stroke -tobacco smoker -vaginal bleeding -an unusual or allergic reaction to medroxyprogesterone, other hormones, medicines, foods, dyes, or preservatives -pregnant or trying to get pregnant -breast-feeding How should I use this medicine? Depo-Provera Contraceptive injection is given into a muscle. Depo-subQ Provera 104 injection is given under the skin. These injections are given by a health care professional. You must not be pregnant before getting an injection. The injection is usually given during the first 5 days after the start of a menstrual period or 6 weeks after delivery of a baby. Talk to your pediatrician regarding the use of this medicine in children. Special care may be needed. These injections have been used in female children who have started having menstrual periods. Overdosage: If you think you have taken too much of this medicine contact a poison control  center or emergency room at once. NOTE: This medicine is only for you. Do not share this medicine with others. What if I miss a dose? Try not to miss a dose. You must get an injection once every 3 months to maintain birth control. If you cannot keep an appointment, call and reschedule it. If you wait longer than 13 weeks between Depo-Provera contraceptive injections or longer than 14 weeks between Depo-subQ Provera 104 injections, you could get pregnant. Use another method for birth control if you miss your appointment. You may also need a pregnancy test before receiving another injection. What may interact with this medicine? Do not take this medicine with any of the following medications: -bosentan This medicine may also interact with the following medications: -aminoglutethimide -antibiotics or medicines for infections, especially rifampin, rifabutin, rifapentine, and griseofulvin -aprepitant -barbiturate medicines such as phenobarbital or primidone -bexarotene -carbamazepine -medicines for seizures like ethotoin, felbamate, oxcarbazepine, phenytoin, topiramate -modafinil -St. John's wort This list may not describe all possible interactions. Give your health care provider a list of all the medicines, herbs, non-prescription drugs, or dietary supplements you use. Also tell them if you smoke, drink alcohol, or use illegal drugs. Some items may interact with your medicine. What should I watch for while using this medicine? This drug does not protect you against HIV infection (AIDS) or other sexually transmitted diseases. Use of this product may cause you to lose calcium from your bones. Loss of calcium may cause weak bones (osteoporosis). Only use this product for more than 2 years if other forms of birth control are not right for you. The longer you use this product for birth control the more likely you will be  at risk for weak bones. Ask your health care professional how you can keep strong  bones. You may have a change in bleeding pattern or irregular periods. Many females stop having periods while taking this drug. If you have received your injections on time, your chance of being pregnant is very low. If you think you may be pregnant, see your health care professional as soon as possible. Tell your health care professional if you want to get pregnant within the next year. The effect of this medicine may last a long time after you get your last injection. What side effects may I notice from receiving this medicine? Side effects that you should report to your doctor or health care professional as soon as possible: -allergic reactions like skin rash, itching or hives, swelling of the face, lips, or tongue -breast tenderness or discharge -breathing problems -changes in vision -depression -feeling faint or lightheaded, falls -fever -pain in the abdomen, chest, groin, or leg -problems with balance, talking, walking -unusually weak or tired -yellowing of the eyes or skin Side effects that usually do not require medical attention (report to your doctor or health care professional if they continue or are bothersome): -acne -fluid retention and swelling -headache -irregular periods, spotting, or absent periods -temporary pain, itching, or skin reaction at site where injected -weight gain This list may not describe all possible side effects. Call your doctor for medical advice about side effects. You may report side effects to FDA at 1-800-FDA-1088. Where should I keep my medicine? This does not apply. The injection will be given to you by a health care professional. NOTE: This sheet is a summary. It may not cover all possible information. If you have questions about this medicine, talk to your doctor, pharmacist, or health care provider.  2019 Elsevier/Gold Standard (2008-12-27 18:37:56)

## 2019-01-24 LAB — URINE CYTOLOGY ANCILLARY ONLY
Chlamydia: NEGATIVE
NEISSERIA GONORRHEA: NEGATIVE

## 2019-01-24 LAB — HIV ANTIBODY (ROUTINE TESTING W REFLEX): HIV Screen 4th Generation wRfx: NONREACTIVE

## 2019-02-05 ENCOUNTER — Ambulatory Visit (INDEPENDENT_AMBULATORY_CARE_PROVIDER_SITE_OTHER): Payer: 59

## 2019-02-05 DIAGNOSIS — Z3202 Encounter for pregnancy test, result negative: Secondary | ICD-10-CM | POA: Diagnosis not present

## 2019-02-05 DIAGNOSIS — Z3042 Encounter for surveillance of injectable contraceptive: Secondary | ICD-10-CM | POA: Diagnosis not present

## 2019-02-05 DIAGNOSIS — Z3041 Encounter for surveillance of contraceptive pills: Secondary | ICD-10-CM

## 2019-02-05 LAB — POCT URINE PREGNANCY: Preg Test, Ur: NEGATIVE

## 2019-02-05 MED ORDER — MEDROXYPROGESTERONE ACETATE 150 MG/ML IM SUSP
150.0000 mg | Freq: Once | INTRAMUSCULAR | Status: AC
  Administered 2019-02-05: 150 mg via INTRAMUSCULAR

## 2019-02-05 NOTE — Progress Notes (Signed)
Pt is here for initial depo injection. Injection given in L arm without difficulty. Next injection due 5/5/-5/19

## 2019-02-07 NOTE — Telephone Encounter (Signed)
Error

## 2019-03-22 ENCOUNTER — Telehealth: Payer: Self-pay

## 2019-03-22 NOTE — Telephone Encounter (Signed)
Pt called stating she has some chaffing on her inner thighs, started about a week ago and she has tried neosporin and it has not helped. Pt is wondering if theres any type of cream or ointment we can prescribe her for this. I advised pt this is a dermatology concern not really a gyn problem, but patient states she does not have a dermatologist and would have a hard time getting an appt as a new patient right now with the virus. I advised patient I would forward her concerns to a provider and let her know when I hear back. Pt verbalizes understanding.

## 2019-03-24 ENCOUNTER — Other Ambulatory Visit: Payer: Self-pay | Admitting: Advanced Practice Midwife

## 2019-03-24 DIAGNOSIS — B372 Candidiasis of skin and nail: Secondary | ICD-10-CM

## 2019-03-24 MED ORDER — NYSTATIN 100000 UNIT/GM EX POWD: Freq: Four times a day (QID) | CUTANEOUS | 2 refills | Status: DC

## 2019-04-30 ENCOUNTER — Other Ambulatory Visit: Payer: Self-pay

## 2019-04-30 ENCOUNTER — Ambulatory Visit (INDEPENDENT_AMBULATORY_CARE_PROVIDER_SITE_OTHER): Payer: 59

## 2019-04-30 DIAGNOSIS — Z3042 Encounter for surveillance of injectable contraceptive: Secondary | ICD-10-CM | POA: Diagnosis not present

## 2019-04-30 MED ORDER — MEDROXYPROGESTERONE ACETATE 150 MG/ML IM SUSP
150.0000 mg | INTRAMUSCULAR | Status: AC
  Administered 2019-04-30 – 2019-12-26 (×4): 150 mg via INTRAMUSCULAR

## 2019-04-30 NOTE — Progress Notes (Signed)
Pt is in the office for depo injection R Del, administered and pt tolerated well. Next due 7/27- 8/10 .Marland Kitchen Administrations This Visit    medroxyPROGESTERone (DEPO-PROVERA) injection 150 mg    Admin Date 04/30/2019 Action Given Dose 150 mg Route Intramuscular Administered By Hinton Lovely, RN

## 2019-05-01 NOTE — Progress Notes (Signed)
I have reviewed the chart and agree with nursing staff's documentation of this patient's encounter.  Mora Bellman, MD 05/01/2019 9:16 AM

## 2019-07-17 ENCOUNTER — Ambulatory Visit: Payer: 59

## 2019-07-17 ENCOUNTER — Other Ambulatory Visit: Payer: Self-pay

## 2019-07-17 ENCOUNTER — Ambulatory Visit (INDEPENDENT_AMBULATORY_CARE_PROVIDER_SITE_OTHER): Payer: 59

## 2019-07-17 DIAGNOSIS — Z3042 Encounter for surveillance of injectable contraceptive: Secondary | ICD-10-CM

## 2019-07-17 NOTE — Progress Notes (Signed)
Pt is in the office for depo injection, administered in L Del and pt tolerated well. Next depo due 10/13-10-27 .Marland Kitchen Administrations This Visit    medroxyPROGESTERone (DEPO-PROVERA) injection 150 mg    Admin Date 07/17/2019 Action Given Dose 150 mg Route Intramuscular Administered By Hinton Lovely, RN

## 2019-07-17 NOTE — Progress Notes (Signed)
Patient seen and assessed by nursing staff.  Agree with documentation and plan.  

## 2019-10-04 ENCOUNTER — Ambulatory Visit (INDEPENDENT_AMBULATORY_CARE_PROVIDER_SITE_OTHER): Payer: 59

## 2019-10-04 DIAGNOSIS — Z3042 Encounter for surveillance of injectable contraceptive: Secondary | ICD-10-CM

## 2019-10-04 NOTE — Progress Notes (Signed)
Nurse visit visit for pt supplied Depo Pt is on time for inj  Depo given R Del w/o complaints Next Depo due Dec 31-Jan 14

## 2019-12-26 ENCOUNTER — Ambulatory Visit (INDEPENDENT_AMBULATORY_CARE_PROVIDER_SITE_OTHER): Payer: 59

## 2019-12-26 ENCOUNTER — Other Ambulatory Visit: Payer: Self-pay

## 2019-12-26 DIAGNOSIS — Z3041 Encounter for surveillance of contraceptive pills: Secondary | ICD-10-CM

## 2019-12-26 DIAGNOSIS — Z3042 Encounter for surveillance of injectable contraceptive: Secondary | ICD-10-CM

## 2019-12-26 NOTE — Progress Notes (Signed)
Nurse visit visit for pt supplied Depo Pt is on time for inj Depo given L Del w/o complaints Next Depo due Mar 24 - Apr 7, pt agrees

## 2019-12-26 NOTE — Progress Notes (Signed)
Agree with A & P. 

## 2020-01-01 ENCOUNTER — Telehealth: Payer: Self-pay

## 2020-01-01 ENCOUNTER — Other Ambulatory Visit: Payer: Self-pay | Admitting: Advanced Practice Midwife

## 2020-01-01 DIAGNOSIS — N921 Excessive and frequent menstruation with irregular cycle: Secondary | ICD-10-CM

## 2020-01-01 MED ORDER — NORGESTIMATE-ETH ESTRADIOL 0.25-35 MG-MCG PO TABS: 1.0000 | ORAL_TABLET | Freq: Every day | ORAL | 0 refills | Status: DC

## 2020-01-01 NOTE — Telephone Encounter (Signed)
Pt called and reports that she has been having abnormal bleeding since her last depo injection. Pt was on time for injection and she has been getting depo for about a year. Pt reports this is the first time it has happened. She has been bleeding for about a week, not super heavy but enough to need to wear a pad. Pt would like to know what she should do.

## 2020-03-20 ENCOUNTER — Ambulatory Visit: Payer: 59

## 2020-03-27 ENCOUNTER — Ambulatory Visit (INDEPENDENT_AMBULATORY_CARE_PROVIDER_SITE_OTHER): Payer: 59

## 2020-03-27 ENCOUNTER — Other Ambulatory Visit: Payer: Self-pay

## 2020-03-27 DIAGNOSIS — Z3041 Encounter for surveillance of contraceptive pills: Secondary | ICD-10-CM | POA: Diagnosis not present

## 2020-03-27 MED ORDER — MEDROXYPROGESTERONE ACETATE 150 MG/ML IM SUSP
150.0000 mg | Freq: Once | INTRAMUSCULAR | Status: AC
  Administered 2020-03-27: 150 mg via INTRAMUSCULAR

## 2020-03-27 NOTE — Progress Notes (Signed)
Pt presents for depo Injection today.   Last Depo: 12/26/19 LD ok per new protocol to give injection   Next Depo Due :06/12/20-06/26/20  Depo Given w/o any problems. RD

## 2020-04-19 ENCOUNTER — Other Ambulatory Visit: Payer: Self-pay | Admitting: Obstetrics

## 2020-04-19 DIAGNOSIS — N921 Excessive and frequent menstruation with irregular cycle: Secondary | ICD-10-CM

## 2020-04-30 ENCOUNTER — Other Ambulatory Visit (HOSPITAL_COMMUNITY)
Admission: RE | Admit: 2020-04-30 | Discharge: 2020-04-30 | Disposition: A | Discharge status: Home / Self Care | Payer: 59 | Source: Ambulatory Visit | Attending: Obstetrics and Gynecology | Admitting: Obstetrics and Gynecology

## 2020-04-30 ENCOUNTER — Encounter: Payer: Self-pay | Admitting: Obstetrics and Gynecology

## 2020-04-30 ENCOUNTER — Other Ambulatory Visit: Payer: Self-pay

## 2020-04-30 ENCOUNTER — Ambulatory Visit (INDEPENDENT_AMBULATORY_CARE_PROVIDER_SITE_OTHER): Payer: 59 | Admitting: Obstetrics and Gynecology

## 2020-04-30 VITALS — BP 100/67 | HR 94 | Wt 233.0 lb

## 2020-04-30 DIAGNOSIS — Z01419 Encounter for gynecological examination (general) (routine) without abnormal findings: Secondary | ICD-10-CM | POA: Insufficient documentation

## 2020-04-30 DIAGNOSIS — Z3009 Encounter for other general counseling and advice on contraception: Secondary | ICD-10-CM

## 2020-04-30 DIAGNOSIS — Z124 Encounter for screening for malignant neoplasm of cervix: Secondary | ICD-10-CM | POA: Diagnosis present

## 2020-04-30 DIAGNOSIS — Z113 Encounter for screening for infections with a predominantly sexual mode of transmission: Secondary | ICD-10-CM | POA: Diagnosis not present

## 2020-04-30 NOTE — Progress Notes (Signed)
GYNECOLOGY ANNUAL PREVENTATIVE CARE ENCOUNTER NOTE  Subjective:   Meredith Long is a 39 y.o. G62P2012 female here for a annual gynecologic exam. Current complaints: irregular periods with depo. Tired of taking birth control and interested in [place]. No other complaints. Denies abnormal vaginal bleeding, discharge, pelvic pain, problems with intercourse or other gynecologic concerns.    Interested in [place] screen. No concerns about exposure.  Gynecologic History No LMP recorded. (Menstrual status: Oral contraceptives). Contraception: Depo-Provera injections Last Pap: 12/2017. Results were: negative Last mammogram: n/a  Obstetric History OB History  Gravida Para Term Preterm AB Living  3 2 2   1 2   SAB TAB Ectopic Multiple Live Births    1     2    # Outcome Date GA Lbr Len/2nd Weight Sex Delivery Anes PTL Lv  3 TAB 11/2012          2 Term 02/24/11 [redacted]w[redacted]d  9 lb (4.082 kg) F Vag-Spont EPI  LIV  1 Term 09/28/09 [redacted]w[redacted]d  7 lb (3.175 kg) F Vag-Spont EPI  LIV    Past Medical History:  Diagnosis Date  . GERD (gastroesophageal reflux disease)   . Herpes   . Kidney infection   . Obesity     Past Surgical History:  Procedure Laterality Date  . DILATION AND CURETTAGE OF UTERUS    . LAPAROTOMY N/A 12/17/2013   Procedure: EXPLORATORY LAPAROTOMY;  Surgeon: Lahoma Crocker, MD;  Location: [facility];  Service: Gynecology;  Laterality: N/A;  . OVARIAN CYST REMOVAL Left 12/17/2013   Procedure: OVARIAN CYSTECTOMY;  Surgeon: Lahoma Crocker, MD;  Location: [facility];  Service: Gynecology;  Laterality: Left;  . SALPINGOOPHORECTOMY Right 12/17/2013   Procedure: SALPINGO OOPHORECTOMY;  Surgeon: Lahoma Crocker, MD;  Location: [facility];  Service: Gynecology;  Laterality: Right;    Current Outpatient Medications on File Prior to Visit  Medication Sig Dispense Refill  . phentermine 37.5 MG capsule Take 37.5 mg by mouth every morning.    . SPRINTEC 28 0.25-35 MG-MCG tablet TAKE 1 TABLET BY MOUTH  EVERY DAY 84 tablet 3  . medroxyPROGESTERone (DEPO-PROVERA) 150 MG/ML injection Inject 1 mL (150 mg total) into the muscle every 3 (three) months. 1 mL 4  . miconazole (MONISTAT 7 SIMPLY CURE) 2 % vaginal cream Place 1 Applicatorful vaginally at bedtime. Also use externally for itching (Patient not taking: Reported on 01/23/2019) 45 g 0  . nystatin (MYCOSTATIN/NYSTOP) powder Apply topically 4 (four) times daily. (Patient not taking: Reported on 04/30/2019) 15 g 2   Current Facility-Administered Medications on File Prior to Visit  Medication Dose Route Frequency Provider Last Rate Last Admin  . medroxyPROGESTERone (DEPO-PROVERA) injection 150 mg  150 mg Intramuscular Q90 days Leftwich-Kirby, Lisa A, CNM   150 mg at 12/26/19 1514    Allergies  Allergen Reactions  . Penicillins Other (See Comments)    Does not know    Social History   Socioeconomic History  . Marital status: Single    Spouse name: Not on file  . Number of children: Not on file  . Years of education: Not on file  . Highest education level: Not on file  Occupational History  . Not on file  Tobacco Use  . Smoking status: Never Smoker  . Smokeless tobacco: Never Used  Substance and Sexual Activity  . Alcohol use: No    Alcohol/week: 0.0 standard drinks  . Drug use: No  . Sexual activity: Yes    Partners: Male    Birth  control/protection: Pill  Other Topics Concern  . Not on file  Social History Narrative   Single   Customer service manager   G3P2   Social Determinants of Health   Financial Resource Strain:   . Difficulty of Paying Living Expenses:   Food Insecurity:   . Worried About Charity fundraiser in the Last Year:   . Arboriculturist in the Last Year:   Transportation Needs:   . Film/video editor (Medical):   Marland Kitchen Lack of Transportation (Non-Medical):   Physical Activity:   . Days of Exercise per Week:   . Minutes of Exercise per Session:   Stress:   . Feeling of Stress :   Social Connections:   . Frequency of  Communication with Friends and Family:   . Frequency of Social Gatherings with Friends and Family:   . Attends Religious Services:   . Active Member of Clubs or Organizations:   . Attends Archivist Meetings:   Marland Kitchen Marital Status:   Intimate Partner Violence:   . Fear of Current or Ex-Partner:   . Emotionally Abused:   Marland Kitchen Physically Abused:   . Sexually Abused:     Family History  Problem Relation Age of Onset  . Hypertension Mother   . Hyperlipidemia Other   . Hypertension Other   . Diabetes Other   . Obesity Other     The following portions of the patient's history were reviewed and updated as appropriate: allergies, current medications, past family history, past medical history, past social history, past surgical history and problem list.  Review of Systems Pertinent items are noted in HPI.   Objective:  BP 100/67   Pulse 94   Wt 233 lb (105.7 kg)   BMI 36.49 kg/m  CONSTITUTIONAL: Well-developed, well-nourished female in no acute distress.  HENT:  Normocephalic, atraumatic, External right and left ear normal. Oropharynx is clear and moist EYES: Conjunctivae and EOM are normal. Pupils are equal, round, and reactive to light. No scleral icterus.  NECK: Normal range of motion, supple, no masses.  Normal thyroid.  SKIN: Skin is warm and dry. No rash noted. Not diaphoretic. No erythema. No pallor. NEUROLOGIC: Alert and oriented to person, place, and time. Normal reflexes, muscle tone coordination. No cranial nerve deficit noted. PSYCHIATRIC: Normal mood and affect. Normal behavior. Normal judgment and thought content. CARDIOVASCULAR: Normal heart rate noted, regular rhythm RESPIRATORY: Clear to auscultation bilaterally. Effort and breath sounds normal, no problems with respiration noted. BREASTS: Symmetric in size. No masses, skin changes, nipple drainage, or lymphadenopathy. Left inner breast with 1 cm draining abscess. ABDOMEN: Soft, normal bowel sounds, no  distention noted.  No tenderness, rebound or guarding.  PELVIC: Normal appearing external genitalia; normal appearing vaginal mucosa and cervix.  No abnormal discharge noted.  Pap smear obtained.  Normal uterine size, no other palpable masses, no uterine or adnexal tenderness. MUSCULOSKELETAL: Normal range of motion. No tenderness.  No cyanosis, clubbing, or edema.  2+ distal pulses.  Exam done with chaperone present.  Assessment and Plan:   1. Well woman exam - no acute issues - Cervicovaginal ancillary only( [place])  2. Encounter for counseling regarding contraception Interested in [place], reviewed risks/benefits of surgery in detail Reviewed IUD and placement in detail She will consider both, worried about pain with IUD placement and does not want to worry about contraception anymore  3. Cervical cancer screening - Cytology - PAP( [place])  4. Routine screening for STI (sexually transmitted infection) -  HIV antibody (with reflex) - Hepatitis B surface antigen - Hepatitis C antibody - RPR   Will follow up results of pap smear/STI screen and manage accordingly. Encouraged improvement in diet and exercise.  Mammogram n/a Referral for colonoscopy n/a  Routine preventative health maintenance measures emphasized. Please refer to After Visit Summary for other counseling recommendations.   Total face-to-face time with patient: 30 minutes. Over 50% of encounter was spent on counseling and coordination of care.   Feliz Beam, M.D. Attending Center for [employer] Fish farm manager)

## 2020-04-30 NOTE — Patient Instructions (Signed)
Go to bedsider.org for more information!  Contraception Choices Contraception, also called birth control, refers to methods or devices that prevent pregnancy. Hormonal methods Contraceptive implant A contraceptive implant is a thin, plastic tube that contains a hormone. It is inserted into the upper part of the arm. It can remain in place for up to 3 years. Progestin-only injections Progestin-only injections are injections of progestin, a synthetic form of the hormone progesterone. They are given every 3 months by a health care provider. Birth control pills Birth control pills are pills that contain hormones that prevent pregnancy. They must be taken once a day, preferably at the same time each day. Birth control patch The birth control patch contains hormones that prevent pregnancy. It is placed on the skin and must be changed once a week for three weeks and removed on the fourth week. A prescription is needed to use this method of contraception. Vaginal ring A vaginal ring contains hormones that prevent pregnancy. It is placed in the vagina for three weeks and removed on the fourth week. After that, the process is repeated with a new ring. A prescription is needed to use this method of contraception. Emergency contraceptive Emergency contraceptives prevent pregnancy after unprotected sex. They come in pill form and can be taken up to 5 days after sex. They work best the sooner they are taken after having sex. Most emergency contraceptives are available without a prescription. This method should not be used as your only form of birth control. Barrier methods Female condom A female condom is a thin sheath that is worn over the penis during sex. Condoms keep sperm from going inside a woman's body. They can be used with a spermicide to increase their effectiveness. They should be disposed after a single use. Female condom A female condom is a soft, loose-fitting sheath that is put into the vagina  before sex. The condom keeps sperm from going inside a woman's body. They should be disposed after a single use.  Intrauterine contraception Intrauterine device (IUD) An IUD is a T-shaped device that is put in a woman's uterus. There are two types:  Hormone IUD.This type contains progestin, a synthetic form of the hormone progesterone. This type can stay in place for 3-5 years.  Copper IUD.This type is wrapped in copper wire. It can stay in place for 10 years.  Permanent methods of contraception Female tubal ligation In this method, a woman's fallopian tubes are sealed, tied, or blocked during surgery to prevent eggs from traveling to the uterus.  Female sterilization This is a procedure to tie off the tubes that carry sperm (vasectomy). After the procedure, the man can still ejaculate fluid (semen).  Summary  Contraception, also called birth control, means methods or devices that prevent pregnancy.  Hormonal methods of contraception include implants, injections, pills, patches, vaginal rings, and emergency contraceptives.  Barrier methods of contraception can include female condoms, female condoms, diaphragms, cervical caps, sponges, and spermicides.  There are two types of IUDs (intrauterine devices). An IUD can be put in a woman's uterus to prevent pregnancy for 3-5 years.  Permanent sterilization can be done through a procedure for males, females, or both. This information is not intended to replace advice given to you by your health care provider. Make sure you discuss any questions you have with your health care provider. Document Released: 12/06/2005 Document Revised: 01/08/2017 Document Reviewed: 01/08/2017 Elsevier Interactive Patient Education  2018 [employer].

## 2020-04-30 NOTE — Progress Notes (Signed)
Pt is in office for [facility] and would like to discuss tubal ligation.  Tubal consent has been signed today.

## 2020-05-01 LAB — RPR: RPR Ser Ql: NONREACTIVE

## 2020-05-01 LAB — HIV ANTIBODY (ROUTINE TESTING W REFLEX): HIV Screen 4th Generation wRfx: NONREACTIVE

## 2020-05-01 LAB — CYTOLOGY - PAP
Comment: NEGATIVE
Diagnosis: NEGATIVE
High risk HPV: NEGATIVE

## 2020-05-01 LAB — CERVICOVAGINAL ANCILLARY ONLY
Chlamydia: NEGATIVE
Comment: NEGATIVE
Comment: NORMAL
Neisseria Gonorrhea: NEGATIVE

## 2020-05-01 LAB — HEPATITIS B SURFACE ANTIGEN: Hepatitis B Surface Ag: NEGATIVE

## 2020-05-01 LAB — HEPATITIS C ANTIBODY: Hep C Virus Ab: <0.1 s/co ratio (ref 0.0–0.9)

## 2020-06-19 ENCOUNTER — Ambulatory Visit: Payer: 59

## 2020-06-20 ENCOUNTER — Telehealth: Payer: Self-pay

## 2020-06-20 NOTE — Telephone Encounter (Signed)
-----   Message from Sloan Leiter, MD sent at 06/19/2020  4:53 PM EDT ----- Regarding: RE: Birth Control Change There are risks that go along with pills that we need to discuss, it will be a quick appointment and again, can be done virtually. ----- Message ----- From: Courtney Heys, CMA Sent: 06/19/2020  11:03 AM EDT To: Sloan Leiter, MD Subject: RE: Birth Control Change                       Pt rather not come in states bleeding with depo rather you send a Rx for BCP. I advised pt that you agreed to double book your schedule and do a virtual appt for her pt still rather have pills sent she states she was just here and spoke to you.   ----- Message ----- From: Sloan Leiter, MD Sent: 06/18/2020   1:03 PM EDT To: Courtney Heys, CMA Subject: RE: Birth Control Change                       Please make her a virtual appointment with me, you can overbook me to get her in sooner if needed. Thanks. ----- Message ----- From: Courtney Heys, CMA Sent: 06/17/2020   4:28 PM EDT To: Sloan Leiter, MD Subject: Birth Control Change                           Pt no longer wants to use Depo now wants birth control pills would you like her to come in to discuss? If pills can be sent pt wants Rx sent to [facility] on [address]

## 2020-06-20 NOTE — Telephone Encounter (Signed)
TC to pt to make aware she needs appt per Dr.Davis for management of BCP's and that the visit may be a quick virtual visit . Pt does not have a VM box set up to leave a vm.

## 2020-06-25 ENCOUNTER — Encounter: Payer: Self-pay | Admitting: Obstetrics and Gynecology

## 2020-06-25 ENCOUNTER — Telehealth (INDEPENDENT_AMBULATORY_CARE_PROVIDER_SITE_OTHER): Payer: No Typology Code available for payment source | Admitting: Obstetrics and Gynecology

## 2020-06-25 DIAGNOSIS — Z3009 Encounter for other general counseling and advice on contraception: Secondary | ICD-10-CM

## 2020-06-25 DIAGNOSIS — N921 Excessive and frequent menstruation with irregular cycle: Secondary | ICD-10-CM | POA: Diagnosis not present

## 2020-06-25 MED ORDER — NORGESTIMATE-ETH ESTRADIOL 0.25-35 MG-MCG PO TABS: 1.0000 | ORAL_TABLET | Freq: Every day | ORAL | 3 refills | Status: DC

## 2020-06-25 NOTE — Progress Notes (Signed)
    GYNECOLOGY VIRTUAL VISIT ENCOUNTER NOTE  Provider location: [facility] [place]   I connected with [place] on 06/25/20 at 11:30 AM EDT by MyChart Video Encounter at home and verified that I am speaking with the correct person using two identifiers.   I discussed the limitations, risks, security and privacy concerns of performing an evaluation and management service virtually and the availability of in person appointments. I also discussed with the patient that there may be a patient responsible charge related to this service. The patient expressed understanding and agreed to proceed.   History:  Bren Borys is a 39 y.o. [id number] female being evaluated today for change in contraception to OCPs. She is currently on depo as well as OCPs for control of bleeding and would like to stop depo altogether. Happy with Sprintec. She denies any abnormal vaginal discharge, bleeding, pelvic pain or other concerns.       Past Medical History:  Diagnosis Date  . GERD (gastroesophageal reflux disease)   . Herpes   . Kidney infection   . Obesity    Past Surgical History:  Procedure Laterality Date  . DILATION AND CURETTAGE OF UTERUS    . LAPAROTOMY N/A 12/17/2013   Procedure: EXPLORATORY LAPAROTOMY;  Surgeon: Lahoma Crocker, MD;  Location: [facility];  Service: Gynecology;  Laterality: N/A;  . OVARIAN CYST REMOVAL Left 12/17/2013   Procedure: OVARIAN CYSTECTOMY;  Surgeon: Lahoma Crocker, MD;  Location: [facility];  Service: Gynecology;  Laterality: Left;  . SALPINGOOPHORECTOMY Right 12/17/2013   Procedure: SALPINGO OOPHORECTOMY;  Surgeon: Lahoma Crocker, MD;  Location: [facility];  Service: Gynecology;  Laterality: Right;   The following portions of the patient's history were reviewed and updated as appropriate: allergies, current medications, past family history, past medical history, past social history, past surgical history and problem list.   Health  Maintenance:  Normal pap and negative HRHPV on 5/21.   Review of Systems:  Pertinent items noted in HPI and remainder of comprehensive ROS otherwise negative.  Physical Exam:   General:  Alert, oriented and cooperative. Patient appears to be in no acute distress.  Mental Status: Normal mood and affect. Normal behavior. Normal judgment and thought content.   Respiratory: Normal respiratory effort, no problems with respiration noted  Rest of physical exam deferred due to type of encounter  Labs and Imaging No results found for this or any previous visit (from the past 336 hour(s)). No results found.     Assessment and Plan:     1. Encounter for counseling regarding contraception Reviewed risks of [place] including CV, stroke, VTE with patient. Reviewed that if she had co-morbidities, would recommend switching to another method, however, since she is otherwise healthy and age is only risk factor, okay to continue with CHCs as long as she is aware of risks. She verbalizes understanding and desires to continue with CHCs. Refill sent to pharmacy.    I discussed the assessment and treatment plan with the patient. The patient was provided an opportunity to ask questions and all were answered. The patient agreed with the plan and demonstrated an understanding of the instructions.   The patient was advised to call back or seek an in-person evaluation/go to the ED if the symptoms worsen or if the condition fails to improve as anticipated.  I provided 8 minutes of face-to-face time during this encounter.   Sloan Leiter, MD [facility] [place], [place]

## 2020-06-25 NOTE — Progress Notes (Signed)
Pt is on the phone preparing for virtual visit with provider. Pt desires to switch from Depo to [place] for contraception.

## 2021-03-24 ENCOUNTER — Other Ambulatory Visit: Payer: Self-pay | Admitting: Obstetrics and Gynecology

## 2021-03-24 DIAGNOSIS — N921 Excessive and frequent menstruation with irregular cycle: Secondary | ICD-10-CM

## 2022-02-23 ENCOUNTER — Other Ambulatory Visit: Payer: Self-pay | Admitting: Family Medicine

## 2022-02-23 DIAGNOSIS — N921 Excessive and frequent menstruation with irregular cycle: Secondary | ICD-10-CM

## 2024-08-09 ENCOUNTER — Other Ambulatory Visit (HOSPITAL_COMMUNITY): Payer: Self-pay | Admitting: General Surgery

## 2024-08-10 ENCOUNTER — Encounter: Attending: General Surgery | Admitting: Dietician

## 2024-08-10 ENCOUNTER — Encounter: Payer: Self-pay | Admitting: Dietician

## 2024-08-10 VITALS — Ht 65.0 in | Wt 267.5 lb

## 2024-08-10 DIAGNOSIS — E669 Obesity, unspecified: Secondary | ICD-10-CM | POA: Diagnosis present

## 2024-08-10 NOTE — Progress Notes (Signed)
 Nutrition Assessment for Bariatric Surgery: Pre-Surgery Behavioral and Nutrition Intervention Program   Medical Nutrition Therapy  Appt Start Time: 9040    End Time: 1053  Patient was seen on 08/10/2024 for Pre-Operative Nutrition Assessment. Purpose of todays visit  enhance perioperative outcomes along with a healthy weight maintenance   Referral stated Supervised Weight Loss (SWL) visits needed: 4 bi-weekly visits  Planned surgery: Sleeve Gastrectomy Pt expectation of surgery:  to be under 200 lbs  NUTRITION ASSESSMENT   Anthropometrics  Start weight at [facility]: 267.5 lbs (date: 08/10/2024) Height: 65 in BMI: 44.51 kg/m2     Clinical  Medical hx: obesity Medications: hydroxyzine Labs: pt states she had blood drawn the other day, but dietitian can not see yet. Notable signs/symptoms: none noted Any previous deficiencies? No  Evaluation of Nutritional Deficiencies: Micronutrient Nutrition Focused Physical Exam: Hair: No issues observed Eyes: No issues observed Mouth: No issues observed Neck: No issues observed Nails: No issues observed Skin: No issues observed  Lifestyle & Dietary Hx Pt states she sometimes meal preps, stating she is not consistent. Pt states she does not drink her calories, stating she eats her calories. Pt states she likes to eat a snack before she goes to bed.  Current Physical Activity Recommendations state 150 minutes per week of moderate to vigorous movement including Cardio and 1-2 days of resistance activities as well as flexibility/balance activities:  Pts current physical activity: walking 5 days per week, 40 minutes, with 100% recommendation reached   Sleep Hygiene: duration and quality: better, arthritis in back and will toss and turn. Keeps pillow under legs and that helps.  Current Patient Perceived Stress Level as stated by pt on a scale of 1-10:  4       Stress Management Techniques: go walking  According to the Dietary Guidelines for  Americans Recommendation: equivalent 1.5-2 cups fruits per day, equivalent 2-3 cups vegetables per day and at least half all grains whole  Fruit servings per day (on average): 0-2, meeting 0-100% recommendation  Non-starchy vegetable servings per day (on average): 0-1, meeting 0-50% recommendation  Whole Grains per day (on average): 0  Number of meals missed/skipped per week out of 21: 0  24-Hr Dietary Recall First Meal: egg, toast or bagel Snack: chips or lance crackers Second Meal: salmon, rice, vegetables Snack: chips Third Meal: chicken wings, french fries Snack: bowl of cereal with milk (2%) Beverages: water, juice (sometimes)  Alcoholic beverages per week: 0   Estimated Energy Needs Calories: 1500  NUTRITION DIAGNOSIS  Overweight/obesity (Crisp-3.3) related to past poor dietary habits and physical inactivity as evidenced by patient w/ planned sleeve surgery following dietary guidelines for continued weight loss.  NUTRITION INTERVENTION  Nutrition counseling (C-1) and education (E-2) to facilitate bariatric surgery goals.  Educated pt on micronutrient deficiencies post-surgery and behavioral/dietary strategies to start in order to mitigate that risk   Behavioral and Dietary Interventions Pre-Op Goals Reviewed with the Patient Nutrition: Healthy Eating Behaviors Switch to non-caloric, non-carbonated and non-caffeinated beverages such as  water, unsweetened tea, Crystal Light and zero calorie beverages (aim for 64 oz. per day) Cut out grazing between meals or at night  Find a protein shake you like Eat every 3-5 hours        Eliminate distractions while eating (TV, computer, reading, driving, texting) Take 79-69 minutes to eat a meal  Decrease high sugar foods/decrease high fat/fried foods Eliminate alcoholic beverages Increase protein intake (eggs, fish, chicken, yogurt) before surgery Eat non starchy vegetables 2  times a day 7 days a week Eat complex carbohydrates such as  whole grains and fruits   Behavioral Modification: Physical Activity Increase my usual daily activity (use stairs, park farther, etc.) Engage in _______________________  activity  _______ minutes ______ times per week  Other:    _________________________________________________________________     Problem Solving I will think about my usual eating patterns and how to tweak them How can my friends and family support me Barriers to starting my changes Learn and understand appetite verses hunger   Healthy Coping Allow for ___________ activities per week to help me manage stress Reframe negative thoughts I will keep a picture of someone or something that is my inspiration & look at it daily   Monitoring  Weigh myself once a week  Measure my progress by monitoring how my clothes fit Keep a food record of what I eat and drink for the next ________ (time period) Take pictures of what I eat and drink for the next ________ (time period) Use an app to count steps/day for the next_______ (time period) Measure my progress such as increased energy and more restful sleep Monitor your acid reflux and bowel habits, are they getting better?   *Goals that are bolded indicate the pt would like to start working towards these  Handouts Provided Include  Bariatric Surgery handouts (Nutrition Visits, Pre Surgery Behavioral Change Goals, Protein Shakes Brands to Choose From, Vitamins & Mineral Supplementation)  Learning Style & Readiness for Change Teaching method utilized: Visual, Auditory, and hands on  Demonstrated degree of understanding via: Teach Back  Readiness Level: preparation Barriers to learning/adherence to lifestyle change: nothing identified  RD's Notes for Next Visit Patient progress toward chosen goals   MONITORING & EVALUATION Dietary intake, weekly physical activity, body weight, and preoperative behavioral change goals   Next Steps  Patient is to follow up at [facility] in 2-3 week  for next visit.

## 2024-08-21 ENCOUNTER — Ambulatory Visit (INDEPENDENT_AMBULATORY_CARE_PROVIDER_SITE_OTHER): Payer: Self-pay | Admitting: Licensed Clinical Social Worker

## 2024-08-21 DIAGNOSIS — F4320 Adjustment disorder, unspecified: Secondary | ICD-10-CM | POA: Diagnosis not present

## 2024-08-21 NOTE — Progress Notes (Signed)
 Virtual Visit via Video Note  I connected with Meredith Long on 08/21/24 at  5:00 PM EDT by a video enabled telemedicine application and verified that I am speaking with the correct person using two identifiers.  Location: Patient: virtual, home, [place] Provider: virtual office, [place]   I discussed the limitations of evaluation and management by telemedicine and the availability of in person appointments. The patient expressed understanding and agreed to proceed.  I discussed the assessment and treatment plan with the patient. The patient was provided an opportunity to ask questions and all were answered. The patient agreed with the plan and demonstrated an understanding of the instructions.   The patient was advised to call back or seek an in-person evaluation if the symptoms worsen or if the condition fails to improve as anticipated.  Comprehensive Clinical Assessment (CCA) Note  08/21/2024 Meredith Long [id number]  Chief Complaint:  Chief Complaint  Patient presents with   BARIATRIC SCREENING   Visit Diagnosis:  Encounter Diagnosis  Name Primary?   Adjustment disorder, unspecified type Yes   Disposition:  Clinician sees no significant psychological factors that would hinder the success of bariatric surgery at time of assessment. Clinician supports patient candidacy for Bariatric Surgery.   Patient reports realistic expectations post surgery, is aware of the pre and post surgical process, client reports that behavioral health diagnosis(es) are stable at time of assessment, client reports positive pre and post surgical support system, and client reports motivation to make positive change.      CCA Biopsychosocial Intake/Chief Complaint:  BARIATRIC SCREENING  Current Symptoms/Problems: Meredith Long is a 43 y.o. year old adult patient reporting to [facility] for preliminary screening to determine bariatric surgery eligibility. Patient reports that they have tried several  weight loss interventions in the past, including balloon procedures x 2, and medication management of weight, both unsuccessful.  Meredith Long reports current medical concerns/medical history of arthritis, anemia, back pain.  Patient reports negative history of depression, anxiety, or other mental health disorders. Pt reports that she does have a psychotherapist that she sees on a monthly basis.  Evangelene Tuft denies SI, HI, or perceptual disturbances at time of assessment. Patient denies substance use issues at time of assessment. Meredith Long  reports that they are motivated to make positive changes to contribute to improved wellness and are seeking bariatric weight loss surgery as an intervention to support wellness goals.   Patient Reported Schizophrenia/Schizoaffective Diagnosis in Past: No   Strengths: Good family support; good coping skills (current therapeutic relationship), independent personality  Preferences: Due to unsuccessful weight-loss interventions in the past, patient seeking bariatric weight loss surgery  Abilities: Patient is able to make positive behavioral choices, patient has ability to work full-time, patient is able to recognize behaviors and self, patient willing to make commitments to make lasting change   Type of Services Patient Feels are Needed: Patient seeking bariatric weight loss surgery   Initial Clinical Notes/Concerns: Patient denies significant history of depression or anxiety management/treatment   Mental Health Symptoms Depression:  None   Duration of Depressive symptoms: No data recorded  Mania:  None   Anxiety:   Worrying (worry about kids)   Psychosis:  None   Duration of Psychotic symptoms: No data recorded  Trauma:  None   Obsessions:  None   Compulsions:  None   Inattention:  None   Hyperactivity/Impulsivity:  None   Oppositional/Defiant Behaviors:  None   Emotional Irregularity:  Mood lability   Other  Mood/Personality Symptoms:  Patient denies any additional mood or personality symptoms    Mental Status Exam Appearance and self-care  Stature:  Average   Weight:  Obese   Clothing:  Neat/clean   Grooming:  Normal   Cosmetic use:  Age appropriate   Posture/gait:  Normal   Motor activity:  Not Remarkable   Sensorium  Attention:  Normal   Concentration:  Normal   Orientation:  X5   Recall/memory:  Normal   Affect and Mood  Affect:  Appropriate   Mood:  Euthymic   Relating  Eye contact:  Normal   Facial expression:  Responsive   Attitude toward examiner:  Cooperative   Thought and Language  Speech flow: Clear and Coherent   Thought content:  Appropriate to Mood and Circumstances   Preoccupation:  None   Hallucinations:  None   Organization: Coherent, goal directed  [employer] of Knowledge:  Good   Intelligence:  Above Average   Abstraction:  Normal   Judgement:  Good   Reality Testing:  Adequate   Insight:  Good   Decision Making:  Normal   Social Functioning  Social Maturity:  Responsible   Social Judgement:  Normal   Stress  Stressors:  Illness; Other (Comment) (worry about my own health)   Coping Ability:  Normal   Skill Deficits:  None   Supports:  Friends/Service system; Family     Religion: Religion/Spirituality Are You A Religious Person?: No How Might This Affect Treatment?: Patient denies any religious barriers to treatment  Leisure/Recreation: Leisure / Recreation Do You Have Hobbies?: Yes Leisure and Hobbies: enjoys doing yardwork/plants, reading, walking dog  Exercise/Diet: Exercise/Diet Do You Exercise?: Yes What Type of Exercise Do You Do?: Run/Walk How Many Times a Week Do You Exercise?: 6-7 times a week Have You Gained or Lost A Significant Amount of Weight in the Past Six Months?: Yes-Gained Number of Pounds Gained: 20 Do You Follow a Special Diet?: No Do You Have Any Trouble Sleeping?:  No   CCA Employment/Education Employment/Work Situation: Employment / Work Situation Employment Situation: Employed Where is Patient Currently Employed?: Patient reports that she is currently employed at [employer] has Patient Been Employed?: 10 years Are You Satisfied With Your Job?: Yes Do You Work More Than One Job?: No Work Stressors: Patient denies any significant work stressors at time of assessment Patient's Job has Been Impacted by Current Illness: No What is the Longest Time Patient has Held a Job?: 10 years Where was the Patient Employed at that Time?: Current Has Patient ever Been in the [employer]?: No  Education: Education Is Patient Currently Attending School?: No Last Grade Completed: 12 Did Garment/textile technologist From [employer]?: Yes Did Theme park manager?: Yes What Type of College Degree Do you Have?: Bachelor degree Did You Attend Graduate School?: No What Was Your Major?: Accounting/business Did You Have Any Special Interests In School?: None Did You Have An Individualized Education Program (IIEP): No Did You Have Any Difficulty At School?: No Patient's Education Has Been Impacted by Current Illness: No   CCA Family/Childhood History Family and Relationship History: Family history Marital status: Single Does patient have children?: Yes How many children?: 2 How is patient's relationship with their children?: positive relationship with children  Childhood History:  Childhood History By whom was/is the patient raised?: Mother Additional childhood history information: grew up with mom and sister (10 years younger) Description of patient's relationship with caregiver when they were a child: positive relationship with  mom and sister Patient's description of current relationship with people who raised him/her: Patient reports positive relationship still with mother and sister, and that they both live in [place]  currently How were you disciplined when you got in  trouble as a child/adolescent?: Patient reports that she was disciplined fairly as a child/teen Does patient have siblings?: Yes Number of Siblings: 1 Description of patient's current relationship with siblings: Patient reports positive relationship with sister Did patient suffer any verbal/emotional/physical/sexual abuse as a child?: No Did patient suffer from severe childhood neglect?: No Has patient ever been sexually abused/assaulted/raped as an adolescent or adult?: No Was the patient ever a victim of a crime or a disaster?: No Witnessed domestic violence?: No Has patient been affected by domestic violence as an adult?: No  Child/Adolescent Assessment:     CCA Substance Use Alcohol/Drug Use: Alcohol / Drug Use Pain Medications: SEE MAR Prescriptions: SEE MAR Over the Counter: SEE MAR History of alcohol / drug use?: No history of alcohol / drug abuse Longest period of sobriety (when/how long): Patient reports social drinking, rare drinking Withdrawal Symptoms: None  ASAM's:  Six Dimensions of Multidimensional Assessment  Dimension 1:  Acute Intoxication and/or Withdrawal Potential:   Dimension 1:  Description of individual's past and current experiences of substance use and withdrawal: Social/rare drinking  Dimension 2:  Biomedical Conditions and Complications:   Dimension 2:  Description of patient's biomedical conditions and  complications: None  Dimension 3:  Emotional, Behavioral, or Cognitive Conditions and Complications:  Dimension 3:  Description of emotional, behavioral, or cognitive conditions and complications: None  Dimension 4:  Readiness to Change:  Dimension 4:  Description of Readiness to Change criteria: None  Dimension 5:  Relapse, Continued use, or Continued Problem Potential:  Dimension 5:  Relapse, continued use, or continued problem potential critiera description: None  Dimension 6:  Recovery/Living Environment:  Dimension 6:  Recovery/Iiving environment  criteria description: None  ASAM Severity Score: ASAM's Severity Rating Score: 0  ASAM Recommended Level of Treatment: ASAM Recommended Level of Treatment: Level I Outpatient Treatment   Substance use Disorder (SUD) Substance Use Disorder (SUD)  Checklist Symptoms of Substance Use:  (None)  Recommendations for Services/Supports/Treatments: Recommendations for Services/Supports/Treatments Recommendations For Services/Supports/Treatments: Individual Therapy (Patient to continue with current psychotherapy)  DSM5 Diagnoses: Patient Active Problem List   Diagnosis Date Noted   Routine general medical examination at a health care facility 06/28/2014   Prediabetes 06/23/2012   Severe obesity (BMI >= 40) (HCC) 06/23/2012    Patient Centered Plan: Patient is on the following Treatment Plan(s):   Behavioral Health Assessment  Patient Name Meredith Long Date of Birth:  1981-11-29 Age:  43 y.o. Date of Interview:  08/21/24 Gender:  F   Date of Report : 08/21/24 Purpose:   Bariatric/Weight-loss Surgery (pre-operative evaluation)    Assessment Instruments:  DSM-5-TR Self-Rated Level 1 Cross-Cutting Symptom Measure--Adult Severity Measure for Generalized Anxiety Disorder--Adult EAT-26 (Eating Attitudes Test) SSS-8 (Somatic Symptom Scale)  Chief Complaint: BARIATRIC SCREENING  Client Background: Patient is a 43 year old female seeking weight loss surgery. Patient has college degree Bachelor education and is currently working at [employer] .  Patients marital status is single.  Patient has 2 children..  The patient is 5 feet  6 inches tall and 268 lbs., reflecting a BMI of 43.35 classifying patient in the obese range and at further risk of co-morbid diseases.  Tobacco Use: Patient denies tobacco use.   PATIENT BEHAVIORAL ASSESSMENT SCORES  Personal History of  Mental Illness: Patient denies treatment for depression and anxiety.   Mental Status Examination: Patient was oriented x5 (person,  place, situation, time, and object). Patient was appropriately groomed, and neatly dressed. Patient was alert, engaged, pleasant, and cooperative. Patient denies suicidal and homicidal ideations or any perceptual disturbances. Patient denies self-injury.   DSM-5-TR Self-Rated Level 1 Cross-Cutting Symptom Measure--Adult: Patient completed 23-item questionnaire assessing symptoms related to depression, anger, mania, anxiety, somatic symptoms, suicidal ideation, psychosis, insomnia, memory concerns, repetitive behaviors, dissociation, personality functioning and substance use. Meredith Long scored 1 in irritability domain.   Severity Measure for Generalized Anxiety Disorder--Adult: Patient completed a 10-item  scale. Total scores can range from 0 to 40. A raw score is calculated by summing the answer to each question, and an average total score is achieved by dividing the raw score by the number of items (e.g., 10).Meredith Long had a total raw score of 0 out of 40 which was divided by the total number of questions answered (10) to get an average score of 0 which indicates no significant anxiety.   EAT-26: The EAT-26 is a twenty-six-question screening tool to identify symptoms of dieting behaviors, bulimia, food preoccupation and oral control.  Meredith Long scored 32 out of 26. Scores below a 20 are considered not meeting criteria for disordered eating. Patient denies inducing vomiting, or intentional meal skipping. Patient denies binge eating behaviors. Patient denies laxative abuse. Patient does not meet criteria for a DSM-V eating disorder.   **Clinician discussed high score of this assessment, and all questions were reviewed.  Patient reports that she scored the elements as  always because she has been trying to lose weight for most of her adult life.  Patient states that she does not go on eating binges, and when she exercises for more than 60 minutes/day it is when she is walking her dogs.   Encouraged patient to be open and honest about all eating behaviors with nutritionist and with her current psychotherapist.  Encouraged patient to continue with psychotherapy for at least 6 months after surgery, to assist patient with cognitive behavioral support.  SSS-8: The SSS-8, or Somatic Symptom Scale-8, is a brief self-report questionnaire used to assess the perceived burden of common somatic (physical) symptoms.  (SSS-8) is scored by summing the responses to eight items, each rated on a 5-point Likert scale from 0 (Not at all) to 4 (Very much). Total scores range from 0 to 32, with higher scores indicating greater somatic symptom burden. Scores are categorized into five severity levels: no/minimal, low, medium, high, and very high somatic symptom burden. Meredith Long scored 11 out of 32, which indicates medium score.   Conclusion & Recommendations:   Health history and current assessment reflect that patient is suitable to be a candidate for bariatric surgery. Patient understands the procedure, the risks associated with it, and the importance of post-operative holistic care (Physical, Spiritual/Values, Relationships, and Mental/Emotional health) with access to resources for support as needed. The patient has made an informed decision to proceed with procedure. The patient is motivated and expressed understanding of the post-surgical requirements. Patient's psychological assessment will be valid from today's date for 6 months (02/18/2025). After that date, a follow-up appointment will be needed to re-evaluate the patient's psychological status.   Clinician sees no significant psychological factors that would hinder the success of bariatric surgery at time of assessment. Clinician supports patient candidacy for Bariatric Surgery.   Meredith Long, MSW, LCSW Licensed Clinical Social [facility]  Referrals to Alternative Service(s): Referred to  Alternative Service(s):   Place:   Date:   Time:    Referred to Alternative Service(s):   Place:   Date:   Time:    Referred to Alternative Service(s):   Place:   Date:   Time:    Referred to Alternative Service(s):   Place:   Date:   Time:      Collaboration of Care: Other clinician release this patient back to bariatric team members and encouraged patient to follow all ongoing recommendations and to seek psychotherapy services with psychotherapist of record for at least 6 months past surgical date for ongoing cognitive behavioral support  Patient/Guardian was advised Release of Information must be obtained prior to any record release in order to collaborate their care with an outside provider. Patient/Guardian was advised if they have not already done so to contact the registration department to sign all necessary forms in order for us  to release information regarding their care.   Consent: Patient/Guardian gives verbal consent for treatment and assignment of benefits for services provided during this visit. Patient/Guardian expressed understanding and agreed to proceed.   Meredith Long R Cross Jorge, LCSW

## 2024-08-28 ENCOUNTER — Encounter (HOSPITAL_COMMUNITY)
Admission: RE | Admit: 2024-08-28 | Discharge: 2024-08-28 | Disposition: A | Discharge status: Home / Self Care | Source: Ambulatory Visit | Attending: General Surgery | Admitting: General Surgery

## 2024-08-28 ENCOUNTER — Ambulatory Visit (HOSPITAL_COMMUNITY)
Admission: RE | Admit: 2024-08-28 | Discharge: 2024-08-28 | Disposition: A | Discharge status: Home / Self Care | Source: Ambulatory Visit | Attending: General Surgery | Admitting: General Surgery

## 2024-08-28 ENCOUNTER — Ambulatory Visit: Admitting: Dietician

## 2024-08-28 ENCOUNTER — Other Ambulatory Visit: Payer: Self-pay

## 2024-08-28 ENCOUNTER — Encounter: Payer: Self-pay | Admitting: Dietician

## 2024-08-28 DIAGNOSIS — Z01818 Encounter for other preprocedural examination: Secondary | ICD-10-CM | POA: Diagnosis present

## 2024-08-28 DIAGNOSIS — E669 Obesity, unspecified: Secondary | ICD-10-CM

## 2024-08-28 NOTE — Progress Notes (Signed)
 Supervised Weight Loss Visit Bariatric Nutrition Education Appt Start Time: 1515    End Time: 1531  I connected with Meredith Long on 08/28/24 at  3:15 PM EDT by a video enabled application.  Planned surgery: Sleeve Gastrectomy Pt expectation of surgery:  to be under 200 lbs  1 out of 4 SWL Appointments   Referral stated Supervised Weight Loss (SWL) visits needed: 4 bi-weekly visits  NUTRITION ASSESSMENT   Anthropometrics  Start weight at [facility]: 267.5 lbs (date: 08/10/2024) Height: 65 in Weight today: virtual visit BMI: 44.51 kg/m2     Clinical  Medical hx: obesity Medications: hydroxyzine Labs: pt states she had blood drawn the other day, but dietitian can not see yet. Notable signs/symptoms: none noted Any previous deficiencies? No  Lifestyle & Dietary Hx  Pt states she has done better not snacking a lot.  Estimated daily fluid intake: 32-48 oz Supplements: vit D Current average weekly physical activity: walking 2 miles per day (40 minutes)  24-Hr Dietary Recall First Meal: egg, toast or bagel Snack: chips or lance crackers Second Meal: salmon, rice, vegetables Snack: chips Third Meal: chicken wings, french fries Snack: bowl of cereal with milk (2%) Beverages: water, juice (sometimes)  Alcoholic beverages per week: 0   Estimated Energy Needs Calories: 1500  NUTRITION DIAGNOSIS  Overweight/obesity ([place]-3.3) related to past poor dietary habits and physical inactivity as evidenced by patient w/ planned sleeve surgery following dietary guidelines for continued weight loss.  NUTRITION INTERVENTION  Nutrition counseling (C-1) and education (E-2) to facilitate bariatric surgery goals.  Including a protein source with every meal or snack is key to supporting satiety and helping you reach your daily protein goal of at least 60 grams after bariatric surgery. Protein helps you feel fuller for longer, reduces cravings, and supports muscle preservation during weight  loss. By consistently pairing meals and snacks with lean protein--such as eggs, [place] yogurt, cottage cheese, tofu, or chicken--you can better manage hunger and stay on track with your nutrition goals. This habit is especially important in the early stages post-op when meal sizes are small and nutrient intake must be prioritized.  Aiming for 64 ounces of hydrating fluids per day is essential for maintaining proper hydration, especially before and after bariatric surgery. Fluids support digestion, circulation, and nutrient absorption, and help prevent common issues like constipation and fatigue. Building the habit of sipping fluids consistently throughout the day--rather than drinking large amounts at once--prepares your body for the post-op phase when fluid intake must be carefully managed due to reduced stomach capacity. Water, herbal teas, and broth are great options, and tracking your intake can help ensure you're meeting your daily goal.  Pre-Op Goals Reviewed with the Patient  Pre-Op Goals Progress & New Goals Continue: Cut out grazing between meals or at night  Continue: Decrease high sugar foods/decrease high fat/fried foods New: aim for a protein with meals and snacks New: increase fluid intake; aim for 64 oz per day  Handouts Provided Include  Bariatric MyPlate (emailed)  Learning Style & Readiness for Change Teaching method utilized: Visual & Auditory  Demonstrated degree of understanding via: Teach Back  Readiness Level: preparation Barriers to learning/adherence to lifestyle change: nothing identified  RD's Notes for next Visit  Patient progress toward chosen goals.  MONITORING & EVALUATION Dietary intake, weekly physical activity, body weight, and pre-op goals.  Next Steps  Patient is to return to [facility] in 2 weeks for next SWL visit.

## 2024-08-30 ENCOUNTER — Ambulatory Visit: Admitting: Dietician

## 2024-09-11 ENCOUNTER — Encounter: Payer: Self-pay | Admitting: Dietician

## 2024-09-11 ENCOUNTER — Encounter: Attending: General Surgery | Admitting: Dietician

## 2024-09-11 VITALS — Ht 65.0 in | Wt 268.8 lb

## 2024-09-11 DIAGNOSIS — E669 Obesity, unspecified: Secondary | ICD-10-CM | POA: Diagnosis present

## 2024-09-11 NOTE — Progress Notes (Signed)
 Supervised Weight Loss Visit Bariatric Nutrition Education Appt Start Time: 0831    End Time: 0852  Planned surgery: Sleeve Gastrectomy Pt expectation of surgery:  to be under 200 lbs  3 out of 4 SWL Appointments   Referral stated Supervised Weight Loss (SWL) visits needed: 4 bi-weekly visits  NUTRITION ASSESSMENT   Anthropometrics  Start weight at [facility]: 267.5 lbs (date: 08/10/2024) Height: 65 in Weight today: 268.8 lb BMI: 44.73 kg/m2     Clinical  Medical hx: obesity Medications: hydroxyzine Labs: pt states she had blood drawn the other day, but dietitian can not see yet. Notable signs/symptoms: none noted Any previous deficiencies? No  Lifestyle & Dietary Hx  Pt states she made a checklist of things to complete during.  Estimated daily fluid intake: 64 oz Supplements: vit D Current average weekly physical activity: walking 2 miles per day (40 minutes)  24-Hr Dietary Recall First Meal: egg, toast or bagel Snack: chips or lance crackers Second Meal: salmon, rice, vegetables Snack: chips Third Meal: chicken wings, french fries Snack: bowl of cereal with milk (2%) Beverages: water, water with flavorings  Alcoholic beverages per week: 0   Estimated Energy Needs Calories: 1500  NUTRITION DIAGNOSIS  Overweight/obesity ([place]-3.3) related to past poor dietary habits and physical inactivity as evidenced by patient w/ planned sleeve surgery following dietary guidelines for continued weight loss.  NUTRITION INTERVENTION  Nutrition counseling (C-1) and education (E-2) to facilitate bariatric surgery goals.  After bariatric surgery, it's important to avoid drinking fluids with meals or snacks. Drinking while eating can cause the stomach pouch to fill too quickly, leading to discomfort, nausea, or vomiting. It may also wash food through the pouch too fast, reducing the feeling of fullness and potentially leading to overeating or poor nutrient absorption. To support healing and  long-term success, patients should stop drinking 15 minutes before meals and wait at least 30 minutes after eating to resume fluids.  Pre-Op Goals Reviewed with the Patient  Pre-Op Goals Progress & New Goals Continue: Cut out grazing between meals or at night  Continue: Decrease high sugar foods/decrease high fat/fried foods Continue: aim for a protein with meals and snacks; track protein; aim for 60 grams per day Continue: increase fluid intake; aim for 64 oz per day New: practice not drinking with meals New: try unflavored protein powder to prepare for after surgery  Handouts Provided Include    Learning Style & Readiness for Change Teaching method utilized: Visual & Auditory  Demonstrated degree of understanding via: Teach Back  Readiness Level: preparation Barriers to learning/adherence to lifestyle change: nothing identified  RD's Notes for next Visit  Patient progress toward chosen goals.  MONITORING & EVALUATION Dietary intake, weekly physical activity, body weight, and pre-op goals.  Next Steps  Patient is to return to [facility] in 2 weeks for next SWL visit.

## 2024-09-24 ENCOUNTER — Encounter: Payer: Self-pay | Admitting: Dietician

## 2024-09-24 ENCOUNTER — Encounter: Attending: General Surgery | Admitting: Dietician

## 2024-09-24 VITALS — Ht 65.0 in | Wt 268.0 lb

## 2024-09-24 DIAGNOSIS — E669 Obesity, unspecified: Secondary | ICD-10-CM | POA: Insufficient documentation

## 2024-09-24 NOTE — Progress Notes (Signed)
 Supervised Weight Loss Visit Bariatric Nutrition Education Appt Start Time: 0830    End Time: 0853  Planned surgery: Sleeve Gastrectomy Pt expectation of surgery:  to be under 200 lbs  Referral stated Supervised Weight Loss (SWL) visits needed: 4 bi-weekly visits  4 out of 4 SWL Appointments   Pt completed visits.   Pt has cleared nutrition requirements.   NUTRITION ASSESSMENT   Anthropometrics  Start weight at [facility]: 267.5 lbs (date: 08/10/2024) Height: 65 in Weight today: 268.0 lb BMI: 44.60 kg/m2     Clinical  Medical hx: obesity Medications: hydroxyzine Labs: pt states she had blood drawn the other day, but dietitian can not see yet. Notable signs/symptoms: none noted Any previous deficiencies? No  Lifestyle & Dietary Hx  Pt states she has done well increasing physical activity and being more intentional with fluid intake. Pt states she wants to get a good gym routine as the weather gets cooler.  Pt states she wants to also work on preparing meals and snacks, stating she  Pt states she is getting at least 60 grams of protein per day.  Estimated daily fluid intake: 64 oz Daily protein: at least 60 grams per day. Supplements: vit D Current average weekly physical activity: walking 2 miles per day (40 minutes)  Alcoholic beverages per week: 0   Estimated Energy Needs Calories: 1500  NUTRITION DIAGNOSIS  Overweight/obesity ([place]-3.3) related to past poor dietary habits and physical inactivity as evidenced by patient w/ planned sleeve surgery following dietary guidelines for continued weight loss.  NUTRITION INTERVENTION  Nutrition counseling (C-1) and education (E-2) to facilitate bariatric surgery goals.  Using unflavored protein powder during the modified full liquid diet after bariatric surgery is essential because it allows patients to meet their high protein needs without overwhelming their taste buds or stomach. Since volume tolerance is low and flavored  supplements can be overly sweet or artificial, unflavored powders offer a versatile, neutral option that can be mixed into both sweet and savory liquids--like broths, soups, or milk--helping to maintain muscle mass, support healing, and prevent protein deficiency during this critical recovery phase.  Pre-Op Goals Reviewed with the Patient  Pre-Op Goals Progress & New Goals Continue: Cut out grazing between meals or at night  Continue: Decrease high sugar foods/decrease high fat/fried foods Continue: aim for a protein with meals and snacks; track protein; aim for 60 grams per day Continue: increase fluid intake; aim for 64 oz per day Continue: practice not drinking with meals Continue: try unflavored protein powder to prepare for after surgery  Handouts Provided Include    Learning Style & Readiness for Change Teaching method utilized: Visual & Auditory  Demonstrated degree of understanding via: Teach Back  Readiness Level: preparation Barriers to learning/adherence to lifestyle change: nothing identified  RD's Notes for next Visit  Patient progress toward chosen goals.  MONITORING & EVALUATION Dietary intake, weekly physical activity, body weight, and pre-op goals.  Next Steps  Pt has completed visits. No further supervised visits required/recommended. Patient is to return to [facility] for pre-op class >2 weeks prior to scheduled surgery.

## 2024-10-15 NOTE — Progress Notes (Signed)
 Virtual Primary Care: Outpatient visit note   Patient location at the time of the visit: [place]   I have verified the patient's location, and I am licensed to practice in that state. Audio and video technology were used to conduct this virtual visit. Patient (or parent/guardian as applicable) consented to virtual care.   Patient is: not a minor              Subjective:    Meredith Long is a 43 y.o. female with past medical history of Obesity here for an acute and screening patient visit.  Insomnia Experiencing difficulty maintaining sleep for the past 6 months.  Able to fall asleep but usually wakes up 5 hours later and difficult for her to sleep thereafter.  She is usually exhausted by the middle of the day.  The patient denies consuming alcohol or caffeine at night.  She does walk regularly, eats a balanced diet, but is experiencing some stress with her two teenage daughters.  She is a single parent.   OTC Melatonin has not helped her.    Obesity BMI is 44.  She is under the co-management of Bariatric Surgery.  She is scheduled for gastric bypass surgery in 2026.  Following a Mediterranean Diet and walking.   Breast Cancer Screening Patient is going for her screening mammogram next week.    Insomnia Chronicity:  Chronic Duration:  6 months Insomnia frequency:  Nightly Progression since onset:  Gradually worsening Insomnia symptoms: frequent awakening   Impact to daytime function: fatigue and malaise   Aggravated by:  Stress at home Consumption of caffeine containing drinks after lunch:  0-1 Relieved by:  Nothing Improvement with current treatment:  No How long after going to bed do you fall asleep?:  30 to 60 minutes Associated symptoms: no nocturia, no weight gain, no change in appetite, no restless legs, no periodic limb movements during sleep, no loud snoring and no pauses in breathing while sleeping     Review of Systems  Constitutional:  Negative for activity  change, appetite change, chills, diaphoresis, fatigue, fever, unexpected weight change and weight gain.  Respiratory:  Negative for apnea, cough, choking, chest tightness, shortness of breath, wheezing and stridor.   Neurological:  Negative for dizziness, tremors, seizures, syncope, facial asymmetry, speech difficulty, weakness, light-headedness, numbness and headaches.  Psychiatric/Behavioral:  Negative for agitation, behavioral problems, confusion, decreased concentration, dysphoric mood, hallucinations, self-injury, sleep disturbance and suicidal ideas. The patient has insomnia. The patient is not nervous/anxious and is not hyperactive.     Current Medications[1]  Allergies[2]  Past Medical History:  Diagnosis Date  . Arthritis   . Ovarian cyst     Family History  Problem Relation Age of Onset  . Hypertension Mother   . Cancer Father        gastric cancer  . Diabetes Neg Hx   . Heart disease Neg Hx   . Hyperlipidemia Neg Hx     Past Surgical History:  Procedure Laterality Date  . OVARIAN CYST REMOVAL    . SALPINGECTOMY      Social History   Socioeconomic History  . Marital status: Not on file    Spouse name: Not on file  . Number of children: Not on file  . Years of education: Not on file  . Highest education level: Not on file  Occupational History  . Not on file  Tobacco Use  . Smoking status: Never    Passive exposure: Never  . Smokeless tobacco:  Never  Substance and Sexual Activity  . Alcohol use: Not Currently    Comment: social  . Drug use: Not on file  . Sexual activity: Not on file  Other Topics Concern  . Not on file  Social History Narrative  . Not on file   Social Drivers of Health   Financial Resource Strain: Not on file  Food Insecurity: Not on file  Transportation Needs: Not on file  Physical Activity: Not on file  Stress: Not on file  Social Connections: Unknown (04/20/2022)   Received from [facility]   Social Network   . Social  Network: Not on file  Housing Stability: Not on file    Objective:    Physical exam: VS: BP 118/80   Resp 14   Ht 5' 5 (1.651 m)   Wt 268 lb (122 kg)   LMP 05/26/2024 (Exact Date)   BMI 44.60 kg/m  Patient's last menstrual period was 05/26/2024 (exact date). All vital signs are self-reported by the patient.  Physical Exam Vitals reviewed.  Constitutional:      General: She is not in acute distress.    Appearance: She is not ill-appearing or toxic-appearing.  HENT:     Head: Normocephalic.     Nose: Nose normal.     Mouth/Throat:     Mouth: Mucous membranes are moist.     Pharynx: Oropharynx is clear.  Eyes:     Conjunctiva/sclera: Conjunctivae normal.     Pupils: Pupils are equal, round, and reactive to light.  Pulmonary:     Effort: Pulmonary effort is normal. No respiratory distress.     Breath sounds: No stridor.  Musculoskeletal:        General: Normal range of motion.     Cervical back: Normal range of motion.  Neurological:     General: No focal deficit present.     Mental Status: She is alert and oriented to person, place, and time. Mental status is at baseline.  Psychiatric:        Mood and Affect: Mood normal.        Behavior: Behavior normal.        Thought Content: Thought content normal.        Judgment: Judgment normal.      Assessment & Plan:   Diagnoses and all orders for this visit:  Adjustment insomnia -     CBC W/DIFF W/PLT; Future -     Comprehensive metabolic panel; Future -     TSH AND FREE T4; Future -     URINALYSIS W/MICROSCOPIC REFLEX TO CULTURE; Future -     hydrOXYzine (ATARAX) 25 MG tablet; Take 1 tablet (25 mg total) by mouth nightly as needed for anxiety for up to 90 days Uncontrolled and chronic Sleep Etiquette reviewed with the patient today Start Hydroxyzine 25 mg prn nightly insomnia.  She used this in the past with some success.  If no improvement, may consider changing to Trazodone.    Class 3 severe obesity due to  excess calories without serious comorbidity with body mass index (BMI) of 40.0 to 44.9 in adult -     TSH AND FREE T4; Future -     Lipid panel; Future Chronic and Uncontrolled.  Under the co-management of Bariatric Surgery Reinforce Lifestyle Intervention  Breast cancer screening by mammogram -     Mammography screening bilateral; Future   Follow up in one month.   Problem List as of 10/15/2024 Reviewed: 10/15/2024  3:12 PM  by Aldo Calvo, DO  None    Health Maintenance Due  Topic Date Due  . Hepatitis C Virus Infection in Adolescents and Adults: Screening (or Modifier) ([facility])  Never done  . OSA Screening: Once using STOP-BANG Questionnaire for Adults with Conditions or high BMI([facility])  Never done  . SDOH Screening Reminder: Annually for all adults ([facility])  Never done  . DTaP/Tdap/Td Vaccines ([facility]) (1 - Tdap) Never done  . Cervical Cancer Screening: 21-65 yrs of age (or Modifier)  Never done  . Hypertension Screening: Adults age 99 yrs or older without known hypertension ([facility])  Never done  . Flu Vaccination: Yearly for ages 18mos through 74 years (or Modifier)([facility])  Never done  . COVID-19 Vaccine Screening: Initial Series and Booster Status ([facility]) (1 - 2024-25 season) Never done    Return in about 4 weeks (around 11/12/2024) for Recheck.  The patient has consented to the release of test result information via telephone voicemail or electronic patient portal/personal health record.  -- Kathleen Freed [facility] 10/15/2024  3:29 PM             [1] Current Outpatient Medications  Medication Sig Dispense Refill  . hydrOXYzine (ATARAX) 25 MG tablet Take 1 tablet (25 mg total) by mouth nightly as needed for anxiety for up to 90 days 90 tablet 0   No current facility-administered medications for this visit.  [2] Allergies Allergen Reactions  . Penicillins Swelling

## 2024-11-13 ENCOUNTER — Other Ambulatory Visit: Payer: Self-pay | Admitting: Medical Genetics

## 2024-11-13 ENCOUNTER — Encounter: Payer: Self-pay | Admitting: Dietician

## 2024-11-13 ENCOUNTER — Encounter: Attending: General Surgery | Admitting: Dietician

## 2024-11-13 VITALS — Ht 65.0 in | Wt 276.2 lb

## 2024-11-13 DIAGNOSIS — E669 Obesity, unspecified: Secondary | ICD-10-CM | POA: Diagnosis present

## 2024-11-13 NOTE — Progress Notes (Signed)
 Pre-Operative Nutrition Class:    Class start Time: 0815   Class End Time: 0920  This was a class of 5 patients.   Patient was seen on 11/13/2024 for Pre-Operative Bariatric Surgery Education [facility]    Surgery date: 12/03/2024 Surgery type: Sleeve Gastrectomy  Anthropometrics  Start weight at [facility]: 267.5 lbs (date: 08/10/2024) Height: 65 in Weight today: 276.2 lb BMI: 45.96 kg/m2     Clinical  Medical hx: obesity Medications: hydroxyzine Labs: pt states she had blood drawn the other day, but dietitian can not see yet. Notable signs/symptoms: none noted Any previous deficiencies? No  Samples given per MNT protocol. Patient educated on appropriate usage: Celebrate Vitamins Multivitamin Lot # [id number] Exp: 07/2025  Celebrate Vitamins Calcium  Lot # [id number] Exp: 12/25  Ensure Max Protein Shake Lot # [id number] Exp: 1AUG2026  The following the learning objectives were met by the patient during this course: Identify Pre-Op Dietary Goals and will begin 2 weeks pre-operatively Identify appropriate sources of fluids and proteins  State protein recommendations and appropriate sources pre and post-operatively Identify Post-Operative Dietary Goals and will follow for 2 weeks post-operatively Identify appropriate multivitamin, calcium, and thiamin sources Describe the need for physical activity post-operatively and will follow MD recommendations State when to call healthcare provider regarding medication questions or post-operative complications When having a diagnosis of diabetes understanding hypoglycemia symptoms and the inclusion of 1 complex carbohydrate per meal  Handouts given during class include: Pre-Op Bariatric Surgery Diet Handout Protein Shake Handout Post-Op Bariatric Surgery Nutrition Handout BELT Program Information Flyer [employer] Information Flyer [facility] Bariatric Supplements Price  List  Follow-Up Plan: Patient will follow-up at [facility] 2 weeks post operatively for diet advancement per MD.

## 2024-11-29 ENCOUNTER — Ambulatory Visit: Payer: Self-pay | Admitting: General Surgery

## 2024-11-29 DIAGNOSIS — Z01818 Encounter for other preprocedural examination: Secondary | ICD-10-CM

## 2024-11-29 NOTE — Progress Notes (Signed)
 Sent message, via epic in basket, requesting orders in epic from Careers adviser.

## 2024-12-04 NOTE — Patient Instructions (Addendum)
 SURGICAL WAITING ROOM VISITATION Patients having surgery or a procedure may have no more than 2 support people in the waiting area - these visitors may rotate.    Children under the age of 28 will not be allowed to visit due to the increase in respiratory illness  Children under the age of 52 must have an adult with them who is not the patient.  If the patient needs to stay at the hospital during part of their recovery, the visitor guidelines for inpatient rooms apply. Pre-op nurse will coordinate an appropriate time for 1 support person to accompany patient in pre-op.  This support person may not rotate.    Please refer to the [facility] website for the visitor guidelines for Inpatients (after your surgery is over and you are in a regular room).       Your procedure is scheduled on: 12-18-24   Report to [facility] Main Entrance    Report to admitting at 5:15 AM   Call this number if you have problems the morning of surgery [phone]   Do not eat food :After 6:00 PM the night before surgery.   After Midnight you may have the following liquids until 4:30 AM DAY OF SURGERY  Water Non-Citrus Juices (without pulp, NO RED-Apple, White grape, White cranberry) Black Coffee (NO MILK/CREAM OR CREAMERS, sugar ok)  Clear Tea (NO MILK/CREAM OR CREAMERS, sugar ok) regular and decaf                             Plain Jell-O (NO RED)                                           Fruit ices (not with fruit pulp, NO RED)                                     Popsicles (NO RED)                                                               Sports drinks like Gatorade (NO RED)                   The day of surgery:  Drink ONE (1) Pre-Surgery G2 by 4:30 AM the morning of surgery. Drink in one sitting. Do not sip.  This drink was given to you during your hospital  pre-op appointment visit. Nothing else to drink after completing the Pre-Surgery G2.          If you have questions, please  contact your surgeons office.   FOLLOW BOWEL PREP AND ANY ADDITIONAL PRE OP INSTRUCTIONS YOU RECEIVED FROM YOUR SURGEON'S OFFICE!!!     Oral Hygiene is also important to reduce your risk of infection.                                    Remember - BRUSH YOUR TEETH THE MORNING OF SURGERY WITH YOUR REGULAR TOOTHPASTE   Do  NOT smoke after Midnight   Take these medicines the morning of surgery with A SIP OF WATER:    Hydroxyzine  Stop all vitamins and herbal supplements 7 days before surgery  Bring CPAP mask and tubing day of surgery.                              You may not have any metal on your body including hair pins, jewelry, and body piercing             Do not wear make-up, lotions, powders, perfumes or deodorant  Do not wear nail polish including gel and S&S, artificial/acrylic nails, or any other type of covering on natural nails including finger and toenails. If you have artificial nails, gel coating, etc. that needs to be removed by a nail salon please have this removed prior to surgery or surgery may need to be canceled/ delayed if the surgeon/ anesthesia feels like they are unable to be safely monitored.   Do not shave  48 hours prior to surgery.    Do not bring valuables to the hospital. [place] IS NOT RESPONSIBLE   FOR VALUABLES.   Contacts, dentures or bridgework may not be worn into surgery.   Bring small overnight bag day of surgery.   DO NOT BRING YOUR HOME MEDICATIONS TO THE HOSPITAL. PHARMACY WILL DISPENSE MEDICATIONS LISTED ON YOUR MEDICATION LIST TO YOU DURING YOUR ADMISSION IN THE HOSPITAL!     Special Instructions: Bring a copy of your healthcare power of attorney and living will documents the day of surgery if you haven't scanned them before.              Please read over the following fact sheets you were given: IF YOU HAVE QUESTIONS ABOUT YOUR PRE-OP INSTRUCTIONS PLEASE CALL [phone] [facility]  If you received a COVID test during your pre-op visit   it is requested that you wear a mask when out in public, stay away from anyone that may not be feeling well and notify your surgeon if you develop symptoms. If you test positive for Covid or have been in contact with anyone that has tested positive in the last 10 days please notify you surgeon.   - Preparing for Surgery Before surgery, you can play an important role.  Because skin is not sterile, your skin needs to be as free of germs as possible.  You can reduce the number of germs on your skin by washing with CHG (chlorahexidine gluconate) soap before surgery.  CHG is an antiseptic cleaner which kills germs and bonds with the skin to continue killing germs even after washing. Please DO NOT use if you have an allergy to CHG or antibacterial soaps.  If your skin becomes reddened/irritated stop using the CHG and inform your nurse when you arrive at Short Stay. Do not shave (including legs and underarms) for at least 48 hours prior to the first CHG shower.  You may shave your face/neck.  Please follow these instructions carefully:  1.  Shower with CHG Soap the night before surgery ONLY (DO NOT USE THE SOAP THE MORNING OF SURGERY).  2.  If you choose to wash your hair, wash your hair first as usual with your normal  shampoo.  3.  After you shampoo, rinse your hair and body thoroughly to remove the shampoo.  4.  Use CHG as you would any other liquid soap.  You can apply chg directly to the skin and wash.  Gently with a scrungie or clean washcloth.  5.  Apply the CHG Soap to your body ONLY FROM THE NECK DOWN.   Do   not use on face/ open                           Wound or open sores. Avoid contact with eyes, ears mouth and   genitals (private parts).                       Wash face,  Genitals (private parts) with your normal soap.             6.  Wash thoroughly, paying special attention to the area where your    surgery  will be performed.  7.  Thoroughly rinse your  body with warm water from the neck down.  8.  DO NOT shower/wash with your normal soap after using and rinsing off the CHG Soap.                9.  Pat yourself dry with a clean towel.            10.  Wear clean pajamas.            11.  Place clean sheets on your bed the night of your first shower and do not  sleep with pets. Day of Surgery : Do not apply any CHG, lotions/deodorants the morning of surgery.  Please wear clean clothes to the hospital/surgery center.  FAILURE TO FOLLOW THESE INSTRUCTIONS MAY RESULT IN THE CANCELLATION OF YOUR SURGERY  PATIENT SIGNATURE_________________________________  NURSE SIGNATURE__________________________________  ________________________________________________________________________  WHAT IS A BLOOD TRANSFUSION? Blood Transfusion Information  A transfusion is the replacement of blood or some of its parts. Blood is made up of multiple cells which provide different functions. Red blood cells carry oxygen and are used for blood loss replacement. White blood cells fight against infection. Platelets control bleeding. Plasma helps clot blood. Other blood products are available for specialized needs, such as hemophilia or other clotting disorders. BEFORE THE TRANSFUSION  Who gives blood for transfusions?  Healthy volunteers who are fully evaluated to make sure their blood is safe. This is blood bank blood. Transfusion therapy is the safest it has ever been in the practice of medicine. Before blood is taken from a donor, a complete history is taken to make sure that person has no history of diseases nor engages in risky social behavior (examples are intravenous drug use or sexual activity with multiple partners). The donor's travel history is screened to minimize risk of transmitting infections, such as malaria. The donated blood is tested for signs of infectious diseases, such as HIV and hepatitis. The blood is then tested to be sure it is compatible with  you in order to minimize the chance of a transfusion reaction. If you or a relative donates blood, this is often done in anticipation of surgery and is not appropriate for emergency situations. It takes many days to process the donated blood. RISKS AND COMPLICATIONS Although transfusion therapy is very safe and saves many lives, the main dangers of transfusion include:  Getting an infectious disease. Developing a transfusion reaction. This is an allergic reaction to something in the blood you were given. Every precaution is taken to prevent this. The decision  to have a blood transfusion has been considered carefully by your caregiver before blood is given. Blood is not given unless the benefits outweigh the risks. AFTER THE TRANSFUSION Right after receiving a blood transfusion, you will usually feel much better and more energetic. This is especially true if your red blood cells have gotten low (anemic). The transfusion raises the level of the red blood cells which carry oxygen, and this usually causes an energy increase. The nurse administering the transfusion will monitor you carefully for complications. HOME CARE INSTRUCTIONS  No special instructions are needed after a transfusion. You may find your energy is better. Speak with your caregiver about any limitations on activity for underlying diseases you may have. SEEK MEDICAL CARE IF:  Your condition is not improving after your transfusion. You develop redness or irritation at the intravenous (IV) site. SEEK IMMEDIATE MEDICAL CARE IF:  Any of the following symptoms occur over the next 12 hours: Shaking chills. You have a temperature by mouth above 102 F (38.9 C), not controlled by medicine. Chest, back, or muscle pain. People around you feel you are not acting correctly or are confused. Shortness of breath or difficulty breathing. Dizziness and fainting. You get a rash or develop hives. You have a decrease in urine output. Your urine  turns a dark color or changes to pink, red, or brown. Any of the following symptoms occur over the next 10 days: You have a temperature by mouth above 102 F (38.9 C), not controlled by medicine. Shortness of breath. Weakness after normal activity. The white part of the eye turns yellow (jaundice). You have a decrease in the amount of urine or are urinating less often. Your urine turns a dark color or changes to pink, red, or brown. Document Released: 12/03/2000 Document Revised: 02/28/2012 Document Reviewed: 07/22/2008 [facility] Patient Information 2014 [place], [place].  _______________________________________________________________________

## 2024-12-04 NOTE — Progress Notes (Signed)
 Date of COVID positive in last 90 days:  PCP - [facility] Medical Cardiologist -   Chest x-ray - 09-03-24 Epic EKG - 08-28-24 Epic Stress Test - N/A ECHO - N/A Cardiac Cath - N/A Pacemaker/ICD device last checked:N/A Spinal Cord Stimulator:N/A  Bowel Prep - N/A  Sleep Study - N/A CPAP -   Prediabetes Fasting Blood Sugar - N/A Checks Blood Sugar _____ times a day  Last dose of GLP1 agonist-  N/A GLP1 instructions:  Do not take after     Last dose of SGLT-2 inhibitors-  N/A SGLT-2 instructions:  Do not take after     Blood Thinner Instructions: N/A Last dose:   Time: Aspirin Instructions:N/A Last Dose:  Activity level:  Can go up a flight of stairs and perform activities of daily living without stopping and without symptoms of chest pain or shortness of breath.  Able to exercise without symptoms  Unable to go up a flight of stairs without symptoms of     Anesthesia review: N/A  Patient denies shortness of breath, fever, cough and chest pain at PAT appointment  Patient verbalized understanding of instructions that were given to them at the PAT appointment. Patient was also instructed that they will need to review over the PAT instructions again at home before surgery.

## 2024-12-06 ENCOUNTER — Other Ambulatory Visit: Payer: Self-pay

## 2024-12-06 ENCOUNTER — Encounter (HOSPITAL_COMMUNITY)
Admission: RE | Admit: 2024-12-06 | Discharge: 2024-12-06 | Disposition: A | Discharge status: Home / Self Care | Source: Ambulatory Visit | Attending: General Surgery | Admitting: General Surgery

## 2024-12-06 ENCOUNTER — Encounter (HOSPITAL_COMMUNITY): Payer: Self-pay

## 2024-12-06 DIAGNOSIS — Z01812 Encounter for preprocedural laboratory examination: Secondary | ICD-10-CM | POA: Diagnosis present

## 2024-12-06 DIAGNOSIS — Z01818 Encounter for other preprocedural examination: Secondary | ICD-10-CM

## 2024-12-06 HISTORY — DX: Unspecified osteoarthritis, unspecified site: M19.90

## 2024-12-06 LAB — CBC WITH DIFFERENTIAL/PLATELET
Abs Immature Granulocytes: 0.02 K/uL (ref 0.00–0.07)
Basophils Absolute: 0 K/uL (ref 0.0–0.1)
Basophils Relative: 1 %
Eosinophils Absolute: 0.1 K/uL (ref 0.0–0.5)
Eosinophils Relative: 1 %
HCT: 41.9 % (ref 36.0–46.0)
Hemoglobin: 13.3 g/dL (ref 12.0–15.0)
Immature Granulocytes: 0 %
Lymphocytes Relative: 24 %
Lymphs Abs: 1.7 K/uL (ref 0.7–4.0)
MCH: 25.6 pg — ABNORMAL LOW (ref 26.0–34.0)
MCHC: 31.7 g/dL (ref 30.0–36.0)
MCV: 80.6 fL (ref 80.0–100.0)
Monocytes Absolute: 0.4 K/uL (ref 0.1–1.0)
Monocytes Relative: 6 %
Neutro Abs: 4.9 K/uL (ref 1.7–7.7)
Neutrophils Relative %: 68 %
Platelets: 259 K/uL (ref 150–400)
RBC: 5.2 MIL/uL — ABNORMAL HIGH (ref 3.87–5.11)
RDW: 16 % — ABNORMAL HIGH (ref 11.5–15.5)
WBC: 7.1 K/uL (ref 4.0–10.5)
nRBC: 0 % (ref 0.0–0.2)

## 2024-12-06 LAB — TYPE AND SCREEN
ABO/RH(D): AB POS
Antibody Screen: NEGATIVE

## 2024-12-06 LAB — COMPREHENSIVE METABOLIC PANEL WITH GFR
ALT: 9 U/L (ref 0–44)
AST: 18 U/L (ref 15–41)
Albumin: 3.3 g/dL — ABNORMAL LOW (ref 3.5–5.0)
Alkaline Phosphatase: 73 U/L (ref 38–126)
Anion gap: 8 (ref 5–15)
BUN: 11 mg/dL (ref 6–20)
CO2: 25 mmol/L (ref 22–32)
Calcium: 8.6 mg/dL — ABNORMAL LOW (ref 8.9–10.3)
Chloride: 105 mmol/L (ref 98–111)
Creatinine, Ser: 0.64 mg/dL (ref 0.44–1.00)
GFR, Estimated: >60 mL/min (ref >60)
Glucose, Bld: 85 mg/dL (ref 70–99)
Potassium: 4.2 mmol/L (ref 3.5–5.1)
Sodium: 138 mmol/L (ref 135–145)
Total Bilirubin: 0.5 mg/dL (ref 0.0–1.2)
Total Protein: 6.1 g/dL — ABNORMAL LOW (ref 6.5–8.1)

## 2024-12-17 NOTE — Anesthesia Preprocedure Evaluation (Signed)
 "                                  Anesthesia Evaluation  Patient identified by MRN, date of birth, ID band Patient awake    Reviewed: Allergy & Precautions, NPO status , Patient's Chart, lab work & pertinent test results  History of Anesthesia Complications (+) PONV and history of anesthetic complications  Airway Mallampati: II  TM Distance: >3 FB Neck ROM: Full    Dental no notable dental hx. (+) Teeth Intact,    Pulmonary neg pulmonary ROS   Pulmonary exam normal breath sounds clear to auscultation       Cardiovascular Exercise Tolerance: Good negative cardio ROS Normal cardiovascular exam Rhythm:Regular Rate:Normal     Neuro/Psych negative neurological ROS  negative psych ROS   GI/Hepatic ,GERD  ,,  Endo/Other    Class 3 obesity  Renal/GU Lab Results      Component                Value               Date                               K                        4.2                 12/06/2024                         CREATININE               0.64                12/06/2024                     Musculoskeletal  (+) Arthritis ,    Abdominal   Peds  Hematology Lab Results      Component                Value               Date                      WBC                      7.1                 12/06/2024                HGB                      13.3                12/06/2024                HCT                      41.9                12/06/2024                MCV  80.6                12/06/2024                PLT                      259                 12/06/2024              Anesthesia Other Findings All: PCN  Reproductive/Obstetrics negative OB ROS                              Anesthesia Physical Anesthesia Plan  ASA: 3  Anesthesia Plan: General   Post-op Pain Management: Tylenol  PO (pre-op)*, Ketamine IV* and Lidocaine  infusion*   Induction: Intravenous  PONV Risk Score and Plan: 4 or  greater and Treatment may vary due to age or medical condition, Midazolam , Ondansetron , Dexamethasone , Scopolamine  patch - Pre-op, TIVA and Propofol  infusion  Airway Management Planned: Oral ETT  Additional Equipment: None  Intra-op Plan:   Post-operative Plan: Extubation in OR  Informed Consent: I have reviewed the patients History and Physical, chart, labs and discussed the procedure including the risks, benefits and alternatives for the proposed anesthesia with the patient or authorized representative who has indicated his/her understanding and acceptance.     Dental advisory given  Plan Discussed with: CRNA and Surgeon  Anesthesia Plan Comments: (TIVA GA)         Anesthesia Quick Evaluation  "

## 2024-12-18 ENCOUNTER — Other Ambulatory Visit (HOSPITAL_COMMUNITY): Payer: Self-pay

## 2024-12-18 ENCOUNTER — Encounter (HOSPITAL_COMMUNITY): Payer: Self-pay | Admitting: General Surgery

## 2024-12-18 ENCOUNTER — Other Ambulatory Visit: Payer: Self-pay

## 2024-12-18 ENCOUNTER — Ambulatory Visit (HOSPITAL_COMMUNITY): Payer: Self-pay | Admitting: Anesthesiology

## 2024-12-18 ENCOUNTER — Encounter (HOSPITAL_COMMUNITY)
Admission: RE | Disposition: A | Discharge status: Home / Self Care | Payer: Self-pay | Source: Home / Self Care | Attending: General Surgery

## 2024-12-18 ENCOUNTER — Observation Stay (HOSPITAL_COMMUNITY)
Admission: RE | Admit: 2024-12-18 | Discharge: 2024-12-19 | Disposition: A | Discharge status: Home / Self Care | Attending: General Surgery | Admitting: General Surgery

## 2024-12-18 DIAGNOSIS — E66813 Obesity, class 3: Secondary | ICD-10-CM | POA: Diagnosis present

## 2024-12-18 DIAGNOSIS — Z01818 Encounter for other preprocedural examination: Secondary | ICD-10-CM

## 2024-12-18 DIAGNOSIS — Z6841 Body Mass Index (BMI) 40.0 and over, adult: Secondary | ICD-10-CM | POA: Diagnosis not present

## 2024-12-18 HISTORY — PX: UPPER GI ENDOSCOPY: SHX6162

## 2024-12-18 HISTORY — PX: LAPAROSCOPIC GASTRIC SLEEVE RESECTION: SHX5895

## 2024-12-18 LAB — CBC
HCT: 41 % (ref 36.0–46.0)
Hemoglobin: 13.1 g/dL (ref 12.0–15.0)
MCH: 25.4 pg — ABNORMAL LOW (ref 26.0–34.0)
MCHC: 32 g/dL (ref 30.0–36.0)
MCV: 79.6 fL — ABNORMAL LOW (ref 80.0–100.0)
Platelets: 260 K/uL (ref 150–400)
RBC: 5.15 MIL/uL — ABNORMAL HIGH (ref 3.87–5.11)
RDW: 15.9 % — ABNORMAL HIGH (ref 11.5–15.5)
WBC: 14.1 K/uL — ABNORMAL HIGH (ref 4.0–10.5)
nRBC: 0 % (ref 0.0–0.2)

## 2024-12-18 LAB — CREATININE, SERUM
Creatinine, Ser: 0.69 mg/dL (ref 0.44–1.00)
GFR, Estimated: >60 mL/min

## 2024-12-18 LAB — POCT PREGNANCY, URINE: Preg Test, Ur: NEGATIVE

## 2024-12-18 SURGERY — GASTRECTOMY, SLEEVE, LAPAROSCOPIC
Anesthesia: General

## 2024-12-18 MED ORDER — DEXTROSE-SODIUM CHLORIDE 5-0.45 % IV SOLN: INTRAVENOUS | Status: DC

## 2024-12-18 MED ORDER — OXYCODONE HCL 5 MG PO TABS
5.0000 mg | ORAL_TABLET | Freq: Once | ORAL | Status: AC | PRN
  Administered 2024-12-18: 5 mg via ORAL

## 2024-12-18 MED ORDER — CHLORHEXIDINE GLUCONATE CLOTH 2 % EX PADS: 6.0000 | MEDICATED_PAD | Freq: Once | CUTANEOUS | Status: DC

## 2024-12-18 MED ORDER — LACTATED RINGERS IV SOLN: INTRAVENOUS | Status: DC

## 2024-12-18 MED ORDER — GLYCOPYRROLATE 0.2 MG/ML IJ SOLN
INTRAMUSCULAR | Status: AC
  Filled 2024-12-18: qty 1

## 2024-12-18 MED ORDER — ONDANSETRON HCL 4 MG/2ML IJ SOLN: 4.0000 mg | Freq: Once | INTRAMUSCULAR | Status: DC | PRN

## 2024-12-18 MED ORDER — PROPOFOL 10 MG/ML IV BOLUS
INTRAVENOUS | Status: DC | PRN
  Administered 2024-12-18: 200 mg via INTRAVENOUS

## 2024-12-18 MED ORDER — ONDANSETRON HCL 4 MG/2ML IJ SOLN
INTRAMUSCULAR | Status: AC
  Filled 2024-12-18: qty 2

## 2024-12-18 MED ORDER — DROPERIDOL 2.5 MG/ML IJ SOLN: 0.6250 mg | Freq: Once | INTRAMUSCULAR | Status: DC | PRN

## 2024-12-18 MED ORDER — ACETAMINOPHEN 500 MG PO TABS
1000.0000 mg | ORAL_TABLET | Freq: Three times a day (TID) | ORAL | Status: DC
  Administered 2024-12-18 – 2024-12-19 (×3): 1000 mg via ORAL
  Filled 2024-12-18 (×3): qty 2

## 2024-12-18 MED ORDER — LIDOCAINE 2% (20 MG/ML) 5 ML SYRINGE
INTRAMUSCULAR | Status: DC | PRN
  Administered 2024-12-18: 100 mg via INTRAVENOUS

## 2024-12-18 MED ORDER — ORAL CARE MOUTH RINSE: 15.0000 mL | Freq: Once | OROMUCOSAL | Status: AC

## 2024-12-18 MED ORDER — SIMETHICONE 80 MG PO CHEW
80.0000 mg | CHEWABLE_TABLET | Freq: Four times a day (QID) | ORAL | Status: DC | PRN
  Administered 2024-12-18 – 2024-12-19 (×2): 80 mg via ORAL
  Filled 2024-12-18 (×2): qty 1

## 2024-12-18 MED ORDER — HYDROMORPHONE HCL 1 MG/ML IJ SOLN
INTRAMUSCULAR | Status: DC | PRN
  Administered 2024-12-18: 1 mg via INTRAVENOUS

## 2024-12-18 MED ORDER — FENTANYL CITRATE (PF) 250 MCG/5ML IJ SOLN
INTRAMUSCULAR | Status: DC | PRN
  Administered 2024-12-18: 100 ug via INTRAVENOUS

## 2024-12-18 MED ORDER — LIDOCAINE HCL (PF) 2 % IJ SOLN
INTRAMUSCULAR | Status: AC
  Filled 2024-12-18: qty 20

## 2024-12-18 MED ORDER — OXYCODONE HCL 5 MG PO TABS
ORAL_TABLET | ORAL | Status: AC
  Filled 2024-12-18: qty 1

## 2024-12-18 MED ORDER — FAMOTIDINE IN NACL 20-0.9 MG/50ML-% IV SOLN
20.0000 mg | Freq: Two times a day (BID) | INTRAVENOUS | Status: DC
  Administered 2024-12-18 – 2024-12-19 (×3): 20 mg via INTRAVENOUS
  Filled 2024-12-18 (×3): qty 50

## 2024-12-18 MED ORDER — GABAPENTIN 100 MG PO CAPS
100.0000 mg | ORAL_CAPSULE | Freq: Two times a day (BID) | ORAL | Status: DC
  Administered 2024-12-18 – 2024-12-19 (×3): 100 mg via ORAL
  Filled 2024-12-18 (×3): qty 1

## 2024-12-18 MED ORDER — ONDANSETRON HCL 4 MG/2ML IJ SOLN
INTRAMUSCULAR | Status: DC | PRN
  Administered 2024-12-18: 4 mg via INTRAVENOUS

## 2024-12-18 MED ORDER — KETAMINE HCL 50 MG/5ML IJ SOSY
PREFILLED_SYRINGE | INTRAMUSCULAR | Status: AC
  Filled 2024-12-18: qty 5

## 2024-12-18 MED ORDER — MIDAZOLAM HCL (PF) 2 MG/2ML IJ SOLN
INTRAMUSCULAR | Status: DC | PRN
  Administered 2024-12-18: 2 mg via INTRAVENOUS

## 2024-12-18 MED ORDER — PHENYLEPHRINE HCL (PRESSORS) 10 MG/ML IV SOLN
INTRAVENOUS | Status: AC
  Filled 2024-12-18: qty 1

## 2024-12-18 MED ORDER — LACTATED RINGERS IR SOLN
Status: DC | PRN
  Administered 2024-12-18: 1000 mL

## 2024-12-18 MED ORDER — DEXAMETHASONE SOD PHOSPHATE PF 10 MG/ML IJ SOLN
4.0000 mg | INTRAMUSCULAR | Status: AC
  Administered 2024-12-18: 4 mg via INTRAVENOUS

## 2024-12-18 MED ORDER — ONDANSETRON HCL 4 MG/2ML IJ SOLN
4.0000 mg | INTRAMUSCULAR | Status: DC | PRN
  Administered 2024-12-18 (×2): 4 mg via INTRAVENOUS
  Filled 2024-12-18 (×2): qty 2

## 2024-12-18 MED ORDER — ACETAMINOPHEN 10 MG/ML IV SOLN: 1000.0000 mg | Freq: Once | INTRAVENOUS | Status: DC | PRN

## 2024-12-18 MED ORDER — PROPOFOL 1000 MG/100ML IV EMUL
INTRAVENOUS | Status: AC
  Filled 2024-12-18: qty 100

## 2024-12-18 MED ORDER — ACETAMINOPHEN 160 MG/5ML PO SOLN: 1000.0000 mg | Freq: Three times a day (TID) | ORAL | Status: DC

## 2024-12-18 MED ORDER — ENSURE MAX PROTEIN PO LIQD
2.0000 [oz_av] | ORAL | Status: DC
  Administered 2024-12-19 (×3): 2 [oz_av] via ORAL

## 2024-12-18 MED ORDER — DEXMEDETOMIDINE HCL IN NACL 80 MCG/20ML IV SOLN
INTRAVENOUS | Status: DC | PRN
  Administered 2024-12-18: 8 ug via INTRAVENOUS
  Administered 2024-12-18: 16 ug via INTRAVENOUS

## 2024-12-18 MED ORDER — BUPIVACAINE LIPOSOME 1.3 % IJ SUSP: 20.0000 mL | Freq: Once | INTRAMUSCULAR | Status: DC

## 2024-12-18 MED ORDER — FENTANYL CITRATE (PF) 50 MCG/ML IJ SOSY
PREFILLED_SYRINGE | INTRAMUSCULAR | Status: AC
  Filled 2024-12-18: qty 2

## 2024-12-18 MED ORDER — ACETAMINOPHEN 500 MG PO TABS
1000.0000 mg | ORAL_TABLET | ORAL | Status: AC
  Administered 2024-12-18: 1000 mg via ORAL
  Filled 2024-12-18: qty 2

## 2024-12-18 MED ORDER — LIDOCAINE HCL (PF) 2 % IJ SOLN
INTRAMUSCULAR | Status: AC
  Filled 2024-12-18: qty 5

## 2024-12-18 MED ORDER — PROPOFOL 10 MG/ML IV BOLUS
INTRAVENOUS | Status: AC
  Filled 2024-12-18: qty 20

## 2024-12-18 MED ORDER — HEPARIN SODIUM (PORCINE) 5000 UNIT/ML IJ SOLN
5000.0000 [IU] | INTRAMUSCULAR | Status: AC
  Administered 2024-12-18: 5000 [IU] via SUBCUTANEOUS
  Filled 2024-12-18: qty 1

## 2024-12-18 MED ORDER — FENTANYL CITRATE (PF) 50 MCG/ML IJ SOSY
25.0000 ug | PREFILLED_SYRINGE | INTRAMUSCULAR | Status: DC | PRN
  Administered 2024-12-18 (×3): 50 ug via INTRAVENOUS

## 2024-12-18 MED ORDER — LIDOCAINE 20MG/ML (2%) 15 ML SYRINGE OPTIME
INTRAMUSCULAR | Status: DC | PRN
  Administered 2024-12-18: 1.5 mg/kg/h via INTRAVENOUS

## 2024-12-18 MED ORDER — SODIUM CHLORIDE 0.9 % IV SOLN
2.0000 g | INTRAVENOUS | Status: AC
  Administered 2024-12-18: 2 g via INTRAVENOUS
  Filled 2024-12-18: qty 2

## 2024-12-18 MED ORDER — DEXMEDETOMIDINE HCL IN NACL 80 MCG/20ML IV SOLN
INTRAVENOUS | Status: AC
  Filled 2024-12-18: qty 20

## 2024-12-18 MED ORDER — FENTANYL CITRATE (PF) 50 MCG/ML IJ SOSY
PREFILLED_SYRINGE | INTRAMUSCULAR | Status: AC
  Filled 2024-12-18: qty 1

## 2024-12-18 MED ORDER — SCOPOLAMINE 1 MG/3DAYS TD PT72
1.0000 | MEDICATED_PATCH | TRANSDERMAL | Status: DC
  Administered 2024-12-18: 1 mg via TRANSDERMAL
  Filled 2024-12-18: qty 1

## 2024-12-18 MED ORDER — HYDROMORPHONE HCL 2 MG/ML IJ SOLN
INTRAMUSCULAR | Status: AC
  Filled 2024-12-18: qty 1

## 2024-12-18 MED ORDER — OXYCODONE HCL 5 MG/5ML PO SOLN: 5.0000 mg | Freq: Once | ORAL | Status: AC | PRN

## 2024-12-18 MED ORDER — OXYCODONE HCL 5 MG/5ML PO SOLN
5.0000 mg | Freq: Four times a day (QID) | ORAL | Status: DC | PRN
  Administered 2024-12-19: 5 mg via ORAL
  Filled 2024-12-18 (×2): qty 5

## 2024-12-18 MED ORDER — PROPOFOL 500 MG/50ML IV EMUL
INTRAVENOUS | Status: DC | PRN
  Administered 2024-12-18: 150 ug/kg/min via INTRAVENOUS

## 2024-12-18 MED ORDER — HEPARIN SODIUM (PORCINE) 5000 UNIT/ML IJ SOLN
5000.0000 [IU] | Freq: Three times a day (TID) | INTRAMUSCULAR | Status: DC
  Administered 2024-12-18 – 2024-12-19 (×3): 5000 [IU] via SUBCUTANEOUS
  Filled 2024-12-18 (×3): qty 1

## 2024-12-18 MED ORDER — 0.9 % SODIUM CHLORIDE (POUR BTL) OPTIME
TOPICAL | Status: DC | PRN
  Administered 2024-12-18: 1000 mL

## 2024-12-18 MED ORDER — ROCURONIUM BROMIDE 10 MG/ML (PF) SYRINGE
PREFILLED_SYRINGE | INTRAVENOUS | Status: DC | PRN
  Administered 2024-12-18: 30 mg via INTRAVENOUS
  Administered 2024-12-18: 70 mg via INTRAVENOUS

## 2024-12-18 MED ORDER — BUPIVACAINE-EPINEPHRINE (PF) 0.25% -1:200000 IJ SOLN
INTRAMUSCULAR | Status: AC
  Filled 2024-12-18: qty 60

## 2024-12-18 MED ORDER — SUGAMMADEX SODIUM 200 MG/2ML IV SOLN
INTRAVENOUS | Status: AC
  Filled 2024-12-18: qty 4

## 2024-12-18 MED ORDER — BUPIVACAINE HCL 0.25 % IJ SOLN
INTRAMUSCULAR | Status: DC | PRN
  Administered 2024-12-18: 60 mL

## 2024-12-18 MED ORDER — SUGAMMADEX SODIUM 200 MG/2ML IV SOLN
INTRAVENOUS | Status: DC | PRN
  Administered 2024-12-18: 400 mg via INTRAVENOUS

## 2024-12-18 MED ORDER — HYDRALAZINE HCL 20 MG/ML IJ SOLN: 10.0000 mg | INTRAMUSCULAR | Status: DC | PRN

## 2024-12-18 MED ORDER — PROPOFOL 1000 MG/100ML IV EMUL
INTRAVENOUS | Status: AC
  Filled 2024-12-18: qty 200

## 2024-12-18 MED ORDER — CHLORHEXIDINE GLUCONATE 0.12 % MT SOLN
15.0000 mL | Freq: Once | OROMUCOSAL | Status: AC
  Administered 2024-12-18: 15 mL via OROMUCOSAL

## 2024-12-18 MED ORDER — FENTANYL CITRATE (PF) 100 MCG/2ML IJ SOLN
INTRAMUSCULAR | Status: AC
  Filled 2024-12-18: qty 2

## 2024-12-18 MED ORDER — APREPITANT 40 MG PO CAPS
40.0000 mg | ORAL_CAPSULE | ORAL | Status: AC
  Administered 2024-12-18: 40 mg via ORAL
  Filled 2024-12-18: qty 1

## 2024-12-18 MED ORDER — ROCURONIUM BROMIDE 10 MG/ML (PF) SYRINGE
PREFILLED_SYRINGE | INTRAVENOUS | Status: AC
  Filled 2024-12-18: qty 20

## 2024-12-18 MED ORDER — MORPHINE SULFATE (PF) 2 MG/ML IV SOLN: 1.0000 mg | INTRAVENOUS | Status: DC | PRN

## 2024-12-18 MED ORDER — MIDAZOLAM HCL 2 MG/2ML IJ SOLN
INTRAMUSCULAR | Status: AC
  Filled 2024-12-18: qty 2

## 2024-12-18 SURGICAL SUPPLY — 43 items
BAG COUNTER SPONGE SURGICOUNT (BAG) IMPLANT
BAG LAPAROSCOPIC 12 15 PORT 16 (BASKET) ×1 IMPLANT
BENZOIN TINCTURE PRP APPL 2/3 (GAUZE/BANDAGES/DRESSINGS) ×1 IMPLANT
BLADE SURG SZ11 CARB STEEL (BLADE) ×1 IMPLANT
BNDG ADH 1X3 SHEER STRL LF (GAUZE/BANDAGES/DRESSINGS) ×6 IMPLANT
CHLORAPREP W/TINT 26 (MISCELLANEOUS) ×1 IMPLANT
CLIP APPLIE ROT 13.4 12 LRG (CLIP) IMPLANT
COVER SURGICAL LIGHT HANDLE (MISCELLANEOUS) ×1 IMPLANT
DRAPE UTILITY XL STRL (DRAPES) ×2 IMPLANT
ELECT REM PT RETURN 15FT ADLT (MISCELLANEOUS) ×1 IMPLANT
GAUZE 4X4 16PLY ~~LOC~~+RFID DBL (SPONGE) ×1 IMPLANT
GLOVE BIOGEL PI IND STRL 7.0 (GLOVE) ×1 IMPLANT
GLOVE SURG SS PI 7.0 STRL IVOR (GLOVE) ×1 IMPLANT
GOWN STRL REUS W/ TWL LRG LVL3 (GOWN DISPOSABLE) ×1 IMPLANT
GRASPER SUT TROCAR 14GX15 (MISCELLANEOUS) ×1 IMPLANT
IRRIGATION SUCT STRKRFLW 2 WTP (MISCELLANEOUS) ×1 IMPLANT
KIT BASIN OR (CUSTOM PROCEDURE TRAY) ×1 IMPLANT
KIT TURNOVER KIT A (KITS) ×1 IMPLANT
MARKER SKIN DUAL TIP RULER LAB (MISCELLANEOUS) IMPLANT
MAT PREVALON FULL STRYKER (MISCELLANEOUS) IMPLANT
NEEDLE SPNL 22GX3.5 QUINCKE BK (NEEDLE) ×1 IMPLANT
PACK UNIVERSAL I (CUSTOM PROCEDURE TRAY) ×1 IMPLANT
RELOAD STAPLE 60 3.6 BLU REG (STAPLE) IMPLANT
RELOAD STAPLE 60 3.8 GOLD REG (STAPLE) IMPLANT
RELOAD STAPLE 60 4.1 GRN THCK (STAPLE) IMPLANT
SCISSORS LAP 5X45 EPIX DISP (ENDOMECHANICALS) IMPLANT
SET TUBE SMOKE EVAC HIGH FLOW (TUBING) ×1 IMPLANT
SHEARS HARMONIC 45 ACE (MISCELLANEOUS) ×1 IMPLANT
SLEEVE GASTRECTOMY 40FR VISIGI (MISCELLANEOUS) ×1 IMPLANT
SLEEVE Z-THREAD 5X100MM (TROCAR) ×3 IMPLANT
SOLUTION ANTFG W/FOAM PAD STRL (MISCELLANEOUS) ×1 IMPLANT
SPIKE FLUID TRANSFER (MISCELLANEOUS) ×1 IMPLANT
STAPLER ECHELON LONG 3000 60 (ENDOMECHANICALS) ×1 IMPLANT
STRIP CLOSURE SKIN 1/2X4 (GAUZE/BANDAGES/DRESSINGS) ×1 IMPLANT
SUT ETHIBOND 0 36 GRN (SUTURE) IMPLANT
SUT MNCRL AB 4-0 PS2 18 (SUTURE) ×1 IMPLANT
SUT VICRYL 0 TIES 12 18 (SUTURE) ×1 IMPLANT
SYR 20ML LL LF (SYRINGE) ×1 IMPLANT
SYR 50ML LL SCALE MARK (SYRINGE) ×1 IMPLANT
SYSTEM KII OPTICAL ACCESS 15MM (TROCAR) ×1 IMPLANT
TOWEL OR DSP ST BLU DLX 10/PK (DISPOSABLE) ×1 IMPLANT
TROCAR Z-THREAD OPTICAL 5X100M (TROCAR) ×1 IMPLANT
TUBING CONNECTING 10 (TUBING) ×2 IMPLANT

## 2024-12-18 NOTE — Plan of Care (Signed)
" °  Problem: Education: Goal: Knowledge of the prescribed therapeutic regimen will improve Outcome: Progressing   Problem: Bowel/Gastric: Goal: Gastrointestinal status for postoperative course will improve Outcome: Progressing   Problem: Cardiac: Goal: Ability to maintain an adequate cardiac output Outcome: Progressing   "

## 2024-12-18 NOTE — Op Note (Signed)
 Preop Diagnosis: Obesity Class III  Postop Diagnosis: same  Procedure performed: laparoscopic Sleeve Gastrectomy  Assitant:   Indications:  The patient is a 43 y.o. year-old morbidly obese female who has been followed in the Bariatric Clinic as an outpatient. This patient was diagnosed with morbid obesity with a BMI of Body mass index is 45.35 kg/m.  The patient was counseled extensively in the Bariatric Outpatient Clinic and after a thorough explanation of the risks and benefits of surgery (including death from complications, bowel leak, infection such as peritonitis and/or sepsis, internal hernia, bleeding, need for blood transfusion, bowel obstruction, organ failure, pulmonary embolus, deep venous thrombosis, wound infection, incisional hernia, skin breakdown, and others entailed on the consent form) and after a compliant diet and exercise program, the patient was scheduled for an elective laparoscopic sleeve gastrectomy.  Description of Operation:  Following informed consent, the patient was taken to the operating room and placed on the operating table in the supine position.  She had previously received prophylactic antibiotics and subcutaneous heparin  for DVT prophylaxis in the pre-op holding area.  After induction of general endotracheal anesthesia by the anesthesiologist, the patient underwent placement of sequential compression devices and an oro-gastric tube.  A timeout was confirmed by the surgery and anesthesia teams.  The patient was adequately padded at all pressure points and placed on a footboard to prevent slippage from the OR table during extremes of position during surgery.  She underwent a routine sterile prep and drape of her entire abdomen.    Next, A transverse incision was made under the left subcostal area and a 5mm optical viewing trocar was introduced into the peritoneal cavity. Pneumoperitoneum was applied with a high flow and low pressure. A laparoscope was inserted to  confirm placement. A extraperitoneal block was then placed at the lateral abdominal wall using exparel  diluted with marcaine . 5 additional incisions were placed: 1 5mm trocar to the left of the midline. 1 additional 5mm trocar in the left lateral area, 1 12mm trocar in the right mid abdomen, 1 5mm trocar in the right subcostal area, and a Nathanson retractor was placed through a subxiphoid incision.  Next, a hole was created through the lesser omentum along the greater curve of the stomach to enter the lesser sac. The vessels along the greater omentum were  Then ligated and divided using the Harmonic scalpel moving towards the spleen and then short gastric vessels were ligated and divided in the same fashion to fully mobilize the fundus. The left crus was identified to ensure completion of the dissection. Next the antrum was measured and dissection continued inferiorly along the greater curve towards the pylorus and stopped 6cm from the pylorus.   A 40Fr ViSiGi dilator was placed into the esophgaus and along the lesser curve of the stomach and placed on suction.  1 60mm Gold load echelon stapler(s) followed by 4 60mm blue load echelon stapler(s) were used to make the resection along the antrum being sure to stay well away from the angularis by angling the jaws of the stapler towards the greater curve and later completing the resection staying along the ViSiGi and ensuring the fundus was not retained by appropriately retracting it lateral. Air was inserted through the ViSiGi to perform a leak test showing no bubbles and a neutral lie of the stomach.  The assistant then went and performed an upper endoscopy and leak test. No bubbles were seen and the sleeve and antrum distended appropriately. The specimen was then placed in  an endocatch bag and removed by the 15mm port. Clips were placed in 5 places for bleeding. Hemostasis was ensured. The fascia of the 15mm port was closed with a 0 vicryl by suture passer.  Pneumoperitoneum was evacuated, all ports were removed and all incisions closed with 4-0 monocryl suture in subcuticular fashion. Steristrips and bandaids were put in place for dressing. The patient awoke from anesthesia and was brought to pacu in stable condition. All counts were correct.  Estimated blood loss: <17ml  Specimens:  Sleeve gastrectomy  Local Anesthesia: 60 ml Marcaine  Post-Op Plan:       Pain Management: PO, prn      Antibiotics: Prophylactic      Anticoagulation: Prophylactic, Starting now      Post Op Studies/Consults: Not applicable      Intended Discharge: within 48h      Intended Outpatient Follow-Up: Two Week      Intended Outpatient Studies: Not Applicable      Other: Not Applicable  Herlene Righter Abrial Arrighi

## 2024-12-18 NOTE — Plan of Care (Signed)
" °  Problem: Education: Goal: Knowledge of the prescribed therapeutic regimen will improve Outcome: Progressing   Problem: Bowel/Gastric: Goal: Gastrointestinal status for postoperative course will improve Outcome: Progressing   Problem: Cardiac: Goal: Ability to maintain an adequate cardiac output Outcome: Progressing Goal: Will show no evidence of cardiac arrhythmias Outcome: Progressing   Problem: Nutritional: Goal: Will attain and maintain optimal nutritional status Outcome: Progressing   Problem: Neurological: Goal: Will regain or maintain usual level of consciousness Outcome: Progressing   "

## 2024-12-18 NOTE — Anesthesia Postprocedure Evaluation (Signed)
"   Anesthesia Post Note  Patient: Meredith Long  Procedure(s) Performed: GASTRECTOMY, SLEEVE, LAPAROSCOPIC ENDOSCOPY, UPPER GI TRACT     Patient location during evaluation: PACU Anesthesia Type: General Level of consciousness: awake and alert Pain management: pain level controlled Vital Signs Assessment: post-procedure vital signs reviewed and stable Respiratory status: spontaneous breathing, nonlabored ventilation, respiratory function stable and patient connected to nasal cannula oxygen Cardiovascular status: blood pressure returned to baseline and stable Postop Assessment: no apparent nausea or vomiting Anesthetic complications: no   No notable events documented.  Last Vitals:  Vitals:   12/18/24 1030 12/18/24 1045  BP: (!) 134/91 (!) 136/91  Pulse: 73 67  Resp: 15 12  Temp:    SpO2: 98% 97%    Last Pain:  Vitals:   12/18/24 1045  TempSrc:   PainSc: Asleep                 Garnette DELENA Gab      "

## 2024-12-18 NOTE — Progress Notes (Addendum)
 PHARMACY CONSULT FOR:  Risk Assessment for Post-Discharge VTE Following Bariatric Surgery  Procedure* laparoscopic Sleeve Gastrectomy   Sex F  Black race Y  Age (years) 43  BMI (kg/m2) 45.35  Operation duration (minutes) 43  History of VTE requiring treatment* N  Hypercoagulable condition* N  Liver disorder* N  Pre-op venous stasis N  Pre-op functional health status Independent  Previous foregut or bariatric surg N  Post-op surgical site infection N  Transfusion intra- or post-op* N  Unplanned readmission N  Unplanned reoperation N  GI perforation/leak/obstruction* N  *specific risk factors for portomesenteric venous thrombosis   Predicted probability of 30-day post-discharge VTE:    42.41 % estimated using the [facility] Calculator  Other patient-specific factors to consider:   no   Recommendation for Discharge: Enoxaparin 40 mg [name] q12h x 2 weeks post-discharge   Do NOT do [facility] meds to bed>> [facility] to be [facility] by her insurance.  Meredith Long is a 43 y.o. female who underwent laparoscopic Sleeve Gastrectomy 12/18/2024    Case start: 07:55 Case end: 08:38   Allergies[1]  Patient Measurements: Height: 5' 6 (167.6 cm) Weight: 127.5 kg (281 lb) IBW/kg (Calculated) : 59.3 Body mass index is 45.35 kg/m.  No results for input(s): WBC, HGB, HCT, PLT, APTT, CREATININE, LABCREA, CREAT24HRUR, MG, PHOS, ALBUMIN, PROT, AST, ALT, ALKPHOS, BILITOT, BILIDIR, IBILI in the last 72 hours. Estimated Creatinine Clearance: 125.2 mL/min (by C-G formula based on SCr of 0.64 mg/dL).    Past Medical History:  Diagnosis Date   Arthritis    GERD (gastroesophageal reflux disease)    Herpes    Obesity      Facility-Administered Medications Prior to Admission  Medication Dose Route Frequency Provider Last Rate Last Admin   medroxyPROGESTERone  (DEPO-PROVERA ) injection 150 mg  150 mg Intramuscular Q90 days  Leftwich-Kirby, Lisa A, CNM   150 mg at 12/26/19 1514   Medications Prior to Admission  Medication Sig Dispense Refill Last Dose/Taking   Cholecalciferol (VITAMIN D3 PO) Take 1 tablet by mouth in the morning.   12/11/2024   hydrOXYzine (ATARAX) 25 MG tablet Take 25 mg by mouth daily as needed for anxiety.   Past Week   Probiotic Product (PROBIOTIC PO) Take 1 capsule by mouth in the morning.   12/11/2024   Thiamine HCl (VITAMIN B-1 PO) Take 1 tablet by mouth in the morning.   12/11/2024     Rosaline IVAR Edison, Pharm.D Use secure chat for questions 12/18/2024 11:35 AM     [1]  Allergies Allergen Reactions   Penicillins Other (See Comments)    Does not know

## 2024-12-18 NOTE — H&P (Signed)
" °  Chief Complaint  Patient presents with  RETURN WEIGHT LOSS   Subjective   Meredith Long is a 43 y.o. female established patient in today for: History of Present Illness Meredith Long is a 43 year old female who presents for a pre-operative consultation for a sleeve gastrectomy.  She is scheduled for a sleeve gastrectomy on December 30th with six small abdominal incisions and intraoperative endoscopy. The procedure is classified as outpatient by insurance with an expected overnight hospital stay. She was counseled about risks of postoperative dehydration and constipation related to dietary changes.  There is no problem list on file for this patient.  Outpatient Medications Prior to Visit  Medication Sig Dispense Refill  hydrOXYzine (ATARAX) 25 MG tablet TAKE 1 TO 2 TABLET BY MOUTH AT BEDTIME, TAKE 30 MINS BEFORE BEDTIME  traMADoL (ULTRAM) 50 mg tablet TAKE 1-2 TABLETS BY MOUTH EVERY 4-6 HOURS AS NEEDED FOR PAIN   No facility-administered medications prior to visit.    Objective   Vitals:  11/21/24 0854 11/21/24 0855  BP: 114/83  Pulse: 105  Temp: 36.6 C (97.9 F)  SpO2: 99%  Weight: (!) 126.5 kg (278 lb 12.8 oz)  Height: 167.6 cm (5' 6)  PainSc: 0-No pain   Body mass index is 45 kg/m. Physical Exam Constitutional:  Appearance: Normal appearance.  HENT:  Head: Normocephalic and atraumatic.  Pulmonary:  Effort: Pulmonary effort is normal.  Musculoskeletal:  General: Normal range of motion.  Cervical back: Normal range of motion.  Neurological:  General: No focal deficit present.  Mental Status: She is alert and oriented to person, place, and time. Mental status is at baseline.  Psychiatric:  Mood and Affect: Mood normal.  Behavior: Behavior normal.  Thought Content: Thought content normal.     I reviewed UGI results which showed normal esophagus and stomach anatomy and labs which were normal  Assessment/Plan:   Assessment & Plan Morbid obesity  due to excess calories Scheduled for sleeve gastrectomy on December 30th, 2025. Procedure involves laparoscopic stomach reduction and endoscopic evaluation. Emphasized dietary adjustments to prevent muscle loss and malnutrition. Explained risks and benefits, including potential need for IV fluids post-surgery. - Will perform sleeve gastrectomy on December 30th, 2025. - Monitor recovery for dehydration, constipation, and complications. - Prescribed Miralax for constipation. - Prescribed antacid for three weeks post-surgery. - Implement two-week liquid diet, then high-protein diet. - Check labs at two months and annually for vitamin deficiencies. - Advised two weeks off work for recovery. - Recommended 20-pound lifting limit for three weeks post-surgery. - Advised no long trips or flights for 30 days post-surgery. Diagnoses and all orders for this visit:  Morbid (severe) obesity due to excess calories (CMS-HCC)  "

## 2024-12-18 NOTE — Progress Notes (Signed)
Discussed QI "Goals for Discharge" document with patient including ambulation in halls, Incentive Spirometry use every hour, and oral care.  Also discussed pain and nausea control.  Enabled or verified head of bed 30 degree alarm activated.  BSTOP education provided including BSTOP information guide, "Guide for Pain Management after your Bariatric Procedure".  Diet progression education provided including "Bariatric Surgery Post-Op Food Plan Phase 1: Liquids".  Questions answered.  Will continue to partner with bedside RN and follow up with patient per protocol.  

## 2024-12-18 NOTE — Transfer of Care (Signed)
 Immediate Anesthesia Transfer of Care Note  Patient: Meredith Long  Procedure(s) Performed: GASTRECTOMY, SLEEVE, LAPAROSCOPIC ENDOSCOPY, UPPER GI TRACT  Patient Location: PACU  Anesthesia Type:General  Level of Consciousness: awake, alert , oriented, and drowsy  Airway & Oxygen Therapy: Patient Spontanous Breathing and Patient connected to face mask oxygen  Post-op Assessment: Report given to RN, Post -op Vital signs reviewed and stable, and Patient moving all extremities X 4  Post vital signs: Reviewed and stable  Last Vitals:  Vitals Value Taken Time  BP 130/75 12/18/24 09:08  Temp    Pulse 78 12/18/24 09:11  Resp 21 12/18/24 09:11  SpO2 100 % 12/18/24 09:11  Vitals shown include unfiled device data.  Last Pain:  Vitals:   12/18/24 0606  TempSrc:   PainSc: 0-No pain      Patients Stated Pain Goal: 5 (12/18/24 0600)  Complications: No notable events documented.

## 2024-12-18 NOTE — Discharge Instructions (Signed)

## 2024-12-18 NOTE — Anesthesia Procedure Notes (Signed)
 Procedure Name: Intubation Date/Time: 12/18/2024 7:34 AM  Performed by: Mollie Olivia SAUNDERS, CRNAPre-anesthesia Checklist: Patient identified, Emergency Drugs available, Suction available and Patient being monitored Patient Re-evaluated:Patient Re-evaluated prior to induction Oxygen Delivery Method: Circle system utilized Preoxygenation: Pre-oxygenation with 100% oxygen Induction Type: IV induction Ventilation: Mask ventilation without difficulty Laryngoscope Size: Glidescope and 3 Grade View: Grade I Tube type: Oral Tube size: 7.0 mm Number of attempts: 1 Airway Equipment and Method: Rigid stylet and Video-laryngoscopy Placement Confirmation: ETT inserted through vocal cords under direct vision, positive ETCO2 and breath sounds checked- equal and bilateral Secured at: 22 cm Tube secured with: Tape Dental Injury: Teeth and Oropharynx as per pre-operative assessment

## 2024-12-19 ENCOUNTER — Encounter (HOSPITAL_COMMUNITY): Payer: Self-pay | Admitting: General Surgery

## 2024-12-19 ENCOUNTER — Other Ambulatory Visit (HOSPITAL_BASED_OUTPATIENT_CLINIC_OR_DEPARTMENT_OTHER): Payer: Self-pay

## 2024-12-19 ENCOUNTER — Other Ambulatory Visit (HOSPITAL_COMMUNITY): Payer: Self-pay

## 2024-12-19 DIAGNOSIS — E66813 Obesity, class 3: Secondary | ICD-10-CM | POA: Diagnosis not present

## 2024-12-19 LAB — CBC WITH DIFFERENTIAL/PLATELET
Abs Immature Granulocytes: 0.05 K/uL (ref 0.00–0.07)
Basophils Absolute: 0 K/uL (ref 0.0–0.1)
Basophils Relative: 0 %
Eosinophils Absolute: 0 K/uL (ref 0.0–0.5)
Eosinophils Relative: 0 %
HCT: 39.7 % (ref 36.0–46.0)
Hemoglobin: 13 g/dL (ref 12.0–15.0)
Immature Granulocytes: 0 %
Lymphocytes Relative: 7 %
Lymphs Abs: 1 K/uL (ref 0.7–4.0)
MCH: 25.7 pg — ABNORMAL LOW (ref 26.0–34.0)
MCHC: 32.7 g/dL (ref 30.0–36.0)
MCV: 78.5 fL — ABNORMAL LOW (ref 80.0–100.0)
Monocytes Absolute: 0.5 K/uL (ref 0.1–1.0)
Monocytes Relative: 4 %
Neutro Abs: 12.3 K/uL — ABNORMAL HIGH (ref 1.7–7.7)
Neutrophils Relative %: 89 %
Platelets: 291 K/uL (ref 150–400)
RBC: 5.06 MIL/uL (ref 3.87–5.11)
RDW: 15.9 % — ABNORMAL HIGH (ref 11.5–15.5)
WBC: 13.8 K/uL — ABNORMAL HIGH (ref 4.0–10.5)
nRBC: 0 % (ref 0.0–0.2)

## 2024-12-19 MED ORDER — PANTOPRAZOLE SODIUM 40 MG PO TBEC: 40.0000 mg | DELAYED_RELEASE_TABLET | Freq: Every day | ORAL | 0 refills | Status: DC

## 2024-12-19 MED ORDER — PANTOPRAZOLE SODIUM 40 MG PO TBEC: 40.0000 mg | DELAYED_RELEASE_TABLET | Freq: Every day | ORAL | 0 refills | Status: AC

## 2024-12-19 MED ORDER — ENOXAPARIN SODIUM 40 MG/0.4ML IJ SOSY: 40.0000 mg | PREFILLED_SYRINGE | Freq: Two times a day (BID) | INTRAMUSCULAR | 0 refills | Status: AC

## 2024-12-19 MED ORDER — ENOXAPARIN (LOVENOX) PATIENT EDUCATION KIT
PACK | Freq: Once | Status: AC
  Filled 2024-12-19: qty 1

## 2024-12-19 MED ORDER — ACETAMINOPHEN 500 MG PO TABS: 1000.0000 mg | ORAL_TABLET | Freq: Three times a day (TID) | ORAL | Status: AC

## 2024-12-19 MED ORDER — ENOXAPARIN SODIUM 40 MG/0.4ML IJ SOSY
40.0000 mg | PREFILLED_SYRINGE | Freq: Two times a day (BID) | INTRAMUSCULAR | 0 refills | Status: DC
  Filled 2024-12-19: qty 22.4, 28d supply, fill #0

## 2024-12-19 MED ORDER — ONDANSETRON 4 MG PO TBDP
4.0000 mg | ORAL_TABLET | Freq: Four times a day (QID) | ORAL | 0 refills | Status: DC | PRN
  Filled 2024-12-19: qty 20, 5d supply, fill #0

## 2024-12-19 MED ORDER — ONDANSETRON 4 MG PO TBDP: 4.0000 mg | ORAL_TABLET | Freq: Four times a day (QID) | ORAL | 0 refills | Status: AC | PRN

## 2024-12-19 MED ORDER — OXYCODONE HCL 5 MG PO TABS: 5.0000 mg | ORAL_TABLET | Freq: Four times a day (QID) | ORAL | 0 refills | Status: AC | PRN

## 2024-12-19 NOTE — Progress Notes (Signed)
 [facility] [address] [facility] [place] [facility]. [facility] out of network  for patient. Patient asked if there was anything non-opiate she could take for pain. No other medication available/ordered at this time.

## 2024-12-19 NOTE — Progress Notes (Signed)
" °   12/19/24 1124  TOC Brief Assessment  Insurance and Status Reviewed  Patient has primary care physician Yes  Home environment has been reviewed Single family home  Prior level of function: Independent with ADL's  Prior/Current Home Services No current home services  Social Drivers of Health Review SDOH reviewed no interventions necessary  Readmission risk has been reviewed Yes  Transition of care needs no transition of care needs at this time    "

## 2024-12-19 NOTE — Progress Notes (Signed)
 Pt used teach back method to explain steps of proper lovenox injections which she will be performing at home. Discharge instructions were given to patient and family member present and all questions were answered. Patient aware [facility]. Patient taken to main entrance via wheelchair.

## 2024-12-19 NOTE — Plan of Care (Signed)
" °  Problem: Education: Goal: Knowledge of the prescribed therapeutic regimen will improve Outcome: Adequate for Discharge   Problem: Bowel/Gastric: Goal: Gastrointestinal status for postoperative course will improve Outcome: Adequate for Discharge   Problem: Cardiac: Goal: Ability to maintain an adequate cardiac output Outcome: Adequate for Discharge Goal: Will show no evidence of cardiac arrhythmias Outcome: Adequate for Discharge   Problem: Nutritional: Goal: Will attain and maintain optimal nutritional status Outcome: Adequate for Discharge   "

## 2024-12-19 NOTE — Progress Notes (Signed)
Patient alert and oriented, pain is controlled. Patient is tolerating fluids, advanced to protein shake today, patient is tolerating well. Reviewed Gastric sleeve/bypass discharge instructions with patient and patient is able to articulate understanding. Provided information on BELT program, Support Group, BSTOP-D, and [facility] outpatient pharmacy. Communicated general update of patient status to surgeon. All questions answered. 24hr fluid recall is 420 mL per hydration protocol, bariatric nurse coordinator to make follow-up phone call within one week.

## 2024-12-19 NOTE — Discharge Summary (Signed)
 " Physician Discharge Summary  Meredith Long FMW:[id number] DOB: [birth year] DOA: 12/18/2024  PCP: [facility]  Admit date: 12/18/2024 Discharge date:  12/19/2024   Recommendations for Outpatient Follow-up:   (include homehealth, outpatient follow-up instructions, specific recommendations for PCP to follow-up on, etc.)   Follow-up Information     Jaikob Borgwardt, Herlene Righter, MD Follow up on 01/10/2025.   Specialty: General Surgery Why: Please arrive 15 minutes prior to your appointment at 9:20am Contact information: [address] [place] [address] [phone]         Ugochukwu Chichester, Herlene Righter, MD Follow up on 02/12/2025.   Specialty: General Surgery Why: Please arrive 15 minutes prior to your appointment at 1:30pm Contact information: [address] [place] [address] [phone]                Discharge Diagnoses:  Principal Problem:   Morbid (severe) obesity due to excess calories [facility])   Surgical Procedure: laparoscopic sleeve gastrectomy, upper endoscopy  Discharge Condition: Good Disposition: Home  Diet recommendation: Postoperative sleeve gastrectomy diet (liquids only)  Filed Weights   12/18/24 0600  Weight: 127.5 kg     Hospital Course:  The patient was admitted after undergoing laparoscopic sleeve gastrectomy. POD 0 she ambulated well. POD 1 she was started on the water diet protocol and tolerated 270 ml in the first shift. Once meeting the water amount she was advanced to bariatric protein shakes which they tolerated and were discharged home POD 1.  Treatments: surgery: laparoscopic sleeve gastrectomy  Discharge Instructions  Discharge Instructions     Ambulate hourly while awake   Complete by: As directed    Call MD for:  difficulty breathing, headache or visual disturbances   Complete by: As directed    Call MD for:  persistant dizziness or light-headedness   Complete by: As directed     Call MD for:  persistant nausea and vomiting   Complete by: As directed    Call MD for:  redness, tenderness, or signs of infection (pain, swelling, redness, odor or green/yellow discharge around incision site)   Complete by: As directed    Call MD for:  severe uncontrolled pain   Complete by: As directed    Call MD for:  temperature >101 F   Complete by: As directed    Diet bariatric full liquid   Complete by: As directed    Discharge wound care:   Complete by: As directed    Remove Bandaids tomorrow, ok to shower tomorrow. Steristrips may fall off in 1-3 weeks.   Incentive spirometry   Complete by: As directed    Perform hourly while awake      Allergies as of 12/19/2024       Reactions   Penicillins Other (See Comments)   Does not know        Medication List     TAKE these medications    acetaminophen  500 MG tablet Commonly known as: TYLENOL  Take 2 tablets (1,000 mg total) by mouth every 8 (eight) hours for 5 days.   enoxaparin 40 MG/0.4ML injection Commonly known as: LOVENOX Inject 0.4 mLs (40 mg total) into the skin every 12 (twelve) hours.   hydrOXYzine 25 MG tablet Commonly known as: ATARAX Take 25 mg by mouth daily as needed for anxiety.   ondansetron  4 MG disintegrating tablet Commonly known as: ZOFRAN -ODT Take 1 tablet (4 mg total) by mouth every 6 (six) hours as needed for nausea or vomiting.  pantoprazole 40 MG tablet Commonly known as: PROTONIX Take 1 tablet (40 mg total) by mouth daily.   PROBIOTIC PO Take 1 capsule by mouth in the morning.   VITAMIN B-1 PO Take 1 tablet by mouth in the morning.   VITAMIN D3 PO Take 1 tablet by mouth in the morning.               Discharge Care Instructions  (From admission, onward)           Start     Ordered   12/19/24 0000  Discharge wound care:       Comments: Remove Bandaids tomorrow, ok to shower tomorrow. Steristrips may fall off in 1-3 weeks.   12/19/24 1203             Follow-up Information     Moyinoluwa Dawe, Herlene Righter, MD Follow up on 01/10/2025.   Specialty: General Surgery Why: Please arrive 15 minutes prior to your appointment at 9:20am Contact information: [address] [place] [address] [phone]         Yarianna Varble, Herlene Righter, MD Follow up on 02/12/2025.   Specialty: General Surgery Why: Please arrive 15 minutes prior to your appointment at 1:30pm Contact information: [address] [place] [address] [phone]                  The results of significant diagnostics from this hospitalization (including imaging, microbiology, ancillary and laboratory) are listed below for reference.    Significant Diagnostic Studies: No results found.  Labs: Basic Metabolic Panel: Recent Labs  Lab 12/18/24 1239  CREATININE 0.69   Liver Function Tests: No results for input(s): AST, ALT, ALKPHOS, BILITOT, PROT, ALBUMIN in the last 168 hours.  CBC: Recent Labs  Lab 12/18/24 1239 12/19/24 1002  WBC 14.1* 13.8*  NEUTROABS  --  12.3*  HGB 13.1 13.0  HCT 41.0 39.7  MCV 79.6* 78.5*  PLT 260 291    CBG: No results for input(s): GLUCAP in the last 168 hours.  Principal Problem:   Morbid (severe) obesity due to excess calories [facility])   VTE plan: I will prescribe outpatient chemical prophylaxis of enoxaparin due to this increased risk ([email or web address])  Time coordinating discharge: 15 min  "

## 2024-12-20 LAB — SURGICAL PATHOLOGY

## 2024-12-25 ENCOUNTER — Telehealth (HOSPITAL_COMMUNITY): Payer: Self-pay | Admitting: *Deleted

## 2024-12-25 NOTE — Telephone Encounter (Signed)
 1. Tell me about your pain and pain management?     Pt denies any pain at this time. Pt reported having discomforts on the far right incision a few days prior but it has improved.   2. Let's talk about fluid intake. How much total fluid are you taking in?   Pt states that s/he is working to meet goal of 64 oz of fluid today. Pt encouraged to continue to work towards meeting goal.     3. How much protein have you taken in the last day?    Pt states that she is working to meet the goal of 60g of protein today. Pt has on average been able to consume 45g of protein daily. Pt plans to drink the reminder of goal throughout the day to meet criteria.    4. Have you had nausea? Tell me about when you have experienced nausea and what you did to help?   Pt did not report any nausea.   5. Has the frequency or color changed with your urine?   Pt states that s/he is urinating fine with no changes in frequency or urgency.   6. Tell me what your incisions look like?   Incisions look fine. Pt denies a fever, chills. Pt states incisions are not swollen, open, or draining. Pt encouraged to call [facility] if incisions change.     7. Have you been passing gas? BM?   Pt states that they are having BMs.   Pt states that they have had a BM. Pt instructed to take either Miralax or MoM as instructed per Gastric Bypass/Sleeve Discharge Home Care Instructions. Pt to call surgeon's office if not able to have BM with medication.      8. If a problem or question were to arise who would you call? Do you know contact numbers for [facility], [facility], and [facility]?   Pt knows to call [facility] for surgical, [facility] for nutrition, and [facility] for non-urgent questions or concerns. Pt denies dehydration symptoms. Pt can describe s/sx of dehydration.   9. How has the walking going?   Pt states s/he is walking around and able to be active without difficulty.   10. How has the anticoagulant Lovenox  been going?   LOVENOX : Pt states that  s/he is taking the Lovenox  injections without difficulty. Reinforced education about taking injections q12h and rotating injection sites. Pt also instructed to monitor for unusual bruising and/or signs of bleeding.

## 2024-12-27 ENCOUNTER — Other Ambulatory Visit

## 2025-01-01 ENCOUNTER — Encounter

## 2025-01-01 ENCOUNTER — Encounter: Admitting: Dietician

## 2025-01-01 ENCOUNTER — Other Ambulatory Visit: Payer: Self-pay | Admitting: Medical Genetics

## 2025-01-01 ENCOUNTER — Encounter: Payer: Self-pay | Admitting: Dietician

## 2025-01-01 DIAGNOSIS — E669 Obesity, unspecified: Secondary | ICD-10-CM

## 2025-01-01 DIAGNOSIS — Z006 Encounter for examination for normal comparison and control in clinical research program: Secondary | ICD-10-CM

## 2025-01-01 NOTE — Progress Notes (Signed)
 2 Week Post-Operative Nutrition  Start Time: 1511   End Time: 1553  Patient was seen on 01/01/2025 for Post-Operative Nutrition education at the [facility]. I connected with Ayme Derick on 01/01/2025 at  3:15 PM EST by a virtual application.   Surgery date: 12/18/2024 Surgery type: Sleeve Gastrectomy  Anthropometrics  Start weight at [facility]: 267.5 lbs (date: 08/10/2024) Height: 65 in Weight today: virtual appointment   Clinical  Medical hx: obesity Medications: hydroxyzine Labs: pt states she had blood drawn the other day, but dietitian can not see yet. Notable signs/symptoms: none noted Any previous deficiencies? No Bowel Habits: Every day to every other day no complaints   The following the learning objectives were met by the patient during this course: Identifies Soft Prepped Plan Advancement Guide  Identifies Soft, High Proteins (Phase 1), beginning 2 weeks post-operatively to 3 weeks post-operatively Identifies Additional Soft High Proteins, soft non-starchy vegetables, fruits and starches (Phase 2), beginning 3 weeks post-operatively to 3 months post-operatively Identifies appropriate sources of fluids, proteins, vegetables, fruits and starches Identifies appropriate fat sources and healthy verses unhealthy fat types   States protein, vegetable, fruit and starch recommendations and appropriate sources post-operatively Identifies the need for appropriate texture modifications, mastication, and bite sizes when consuming solids Identifies appropriate fat consumption and sources Identifies appropriate multivitamin and calcium sources post-operatively Describes the need for physical activity post-operatively and will follow MD recommendations States when to call healthcare provider regarding medication questions or post-operative complications   Handouts given during class include: Soft Prepped Plan Advancement Guide   Follow-Up Plan: Patient  will follow-up at [facility] in 10 weeks for 3 month post-op nutrition visit for diet advancement per MD.

## 2025-01-10 ENCOUNTER — Telehealth: Payer: Self-pay | Admitting: Dietician

## 2025-01-10 NOTE — Telephone Encounter (Signed)
 RD called pt to verify fluid intake once starting soft, solid proteins 2 week post-bariatric surgery.   Daily Fluid intake:  Daily Protein intake:  Bowel Habits:   Concerns/issues:   Left Voice Message with call back number

## 2025-03-18 ENCOUNTER — Encounter: Admitting: Dietician
